# Patient Record
Sex: Female | Born: 1980 | Race: White | Hispanic: No | Marital: Married | Smoking: Never smoker
Health system: Southern US, Community
[De-identification: ages and names within clinical notes are randomized; demographics above are authoritative.]

## PROBLEM LIST (undated history)

## (undated) DIAGNOSIS — J45909 Unspecified asthma, uncomplicated: Secondary | ICD-10-CM

## (undated) HISTORY — PX: APPENDECTOMY: SHX54

## (undated) HISTORY — DX: Unspecified asthma, uncomplicated: J45.909

## (undated) HISTORY — PX: PLACEMENT OF BREAST IMPLANTS: SHX6334

## (undated) HISTORY — PX: LASIK: SHX215

---

## 2017-10-05 ENCOUNTER — Ambulatory Visit: Payer: BLUE CROSS/BLUE SHIELD | Admitting: Allergy and Immunology

## 2017-10-05 ENCOUNTER — Encounter: Payer: Self-pay | Admitting: Allergy and Immunology

## 2017-10-05 VITALS — BP 110/82 | HR 76 | Temp 98.4°F | Resp 16 | Ht 68.25 in | Wt 172.4 lb

## 2017-10-05 DIAGNOSIS — H938X2 Other specified disorders of left ear: Secondary | ICD-10-CM | POA: Diagnosis not present

## 2017-10-05 DIAGNOSIS — J4540 Moderate persistent asthma, uncomplicated: Secondary | ICD-10-CM | POA: Diagnosis not present

## 2017-10-05 DIAGNOSIS — J3089 Other allergic rhinitis: Secondary | ICD-10-CM

## 2017-10-05 MED ORDER — BUDESONIDE-FORMOTEROL FUMARATE 160-4.5 MCG/ACT IN AERO: 2.0000 | INHALATION_SPRAY | Freq: Two times a day (BID) | RESPIRATORY_TRACT | 5 refills | Status: DC

## 2017-10-05 MED ORDER — FLUTICASONE PROPIONATE 50 MCG/ACT NA SUSP: NASAL | 5 refills | Status: DC

## 2017-10-05 NOTE — Progress Notes (Signed)
Dear Dr. Haroldine Laws,  Thank you for referring Lisa Cochran to the [facility] on 10/05/2017.   Below is a summation of this patient's evaluation and recommendations.  Thank you for your referral. I will keep you informed about this patient's response to treatment.   If you have any questions please do not hesitate to contact me.   Sincerely,  Jessica Priest, MD Allergy / Immunology [facility]   ______________________________________________________________________    NEW PATIENT NOTE  Referring Provider: Keturah Barre, MD Primary Provider: Jonathon Bellows, DO Date of office visit: 10/05/2017    Subjective:   Chief Complaint:  Lisa Cochran (DOB: 1980-05-20) is a 37 y.o. female who presents to the clinic on 10/05/2017 with a chief complaint of Asthma and Allergic Rhinitis  .     HPI: Virna presents to this clinic in evaluation of allergies and asthma.  She has a long history of allergic rhinoconjunctivitis with sneezing and nasal congestion that appears to be triggered off by visiting her house located in [place] on the weekends as she works in [place] during the weekdays.  She recently visited with a allergist who found her to be allergic to dog as well as other allergens including trees and feathers and dust mite and cockroach.  She has performed house dust avoidance measures and has been consistently using a nasal steroid and she thinks for the most part that her allergies are under very good control at this point in time.  The dog still remain inside the household in [place].  She has had a 2 to 3-year history of developing problems with her lower airways.  Specifically, she would note that she would develop wheezing when she exercised.  Her allergist gave her Symbicort in October 2018 which has for the most part taken care of all of her lower respiratory tract  symptoms.  Rarely does she use a short acting bronchodilator at this point in time and she can exercise.  She runs 4 miles per day.  However, she does have problems using Symbicort.  She will gag if she uses more than 1 inhalation and thus she has been using her Symbicort 160 at a dose of 1 inhalation twice a day without a spacer.  She has also developed an issue with "crackling" in her left ear.  This appeared to occur after she developed shingles affecting her left face including her left eye.  The crackling only occurs with high-pitched sounds and does not interfere with her ability to hear.  Rather, it accentuates the high-pitched sound so that at the end of the sound she hears crackling.  There is no associated vertigo or hearing loss.  She is actually better now than she was several months ago when she did visit with ENT, Dr. Haroldine Laws, for evaluation of this issue.  Her evaluation apparently did not identify any significant abnormality.  Past Medical History:  Diagnosis Date  . Asthma     Past Surgical History:  Procedure Laterality Date  . APPENDECTOMY    . LASIK    . PLACEMENT OF BREAST IMPLANTS      Allergies as of 10/05/2017   No Known Allergies     Medication List      acyclovir 400 MG tablet Commonly known as:  ZOVIRAX Take 800 mg by mouth 5 (five) times daily.   budesonide-formoterol 160-4.5 MCG/ACT inhaler Commonly known as:  SYMBICORT Inhale 1 puff into the lungs 2 (two) times daily.   fluticasone 50 MCG/ACT nasal spray Commonly known as:  FLONASE Place 1 spray into both nostrils daily.   levonorgestrel-ethinyl estradiol tablet Commonly known as:  ENPRESSE,TRIVORA Take 1 tablet by mouth daily.   PROVENTIL HFA 108 (90 Base) MCG/ACT inhaler Generic drug:  albuterol Inhale into the lungs every 6 (six) hours as needed for wheezing or shortness of breath.       Review of systems negative except as noted in HPI / PMHx or noted below:  Review of Systems    Constitutional: Negative.   HENT: Negative.   Eyes: Negative.   Respiratory: Negative.   Cardiovascular: Negative.   Gastrointestinal: Negative.   Genitourinary: Negative.   Musculoskeletal: Negative.   Skin: Negative.   Neurological: Negative.   Endo/Heme/Allergies: Negative.   Psychiatric/Behavioral: Negative.     Family History  Problem Relation Age of Onset  . Allergic rhinitis Mother   . Allergic rhinitis Brother   . Food Allergy Brother        alpha gal  . Angioedema Neg Hx   . Asthma Neg Hx   . Eczema Neg Hx   . Urticaria Neg Hx     Social History   Socioeconomic History  . Marital status: Married    Spouse name: Not on file  . Number of children: Not on file  . Years of education: Not on file  . Highest education level: Not on file  Occupational History  . Not on file  Social Needs  . Financial resource strain: Not on file  . Food insecurity:    Worry: Not on file    Inability: Not on file  . Transportation needs:    Medical: Not on file    Non-medical: Not on file  Tobacco Use  . Smoking status: Never Smoker  . Smokeless tobacco: Never Used  Substance and Sexual Activity  . Alcohol use: Yes    Frequency: Never    Comment: 3 beers a week  . Drug use: Never  . Sexual activity: Not on file  Lifestyle  . Physical activity:    Days per week: Not on file    Minutes per session: Not on file  . Stress: Not on file  Relationships  . Social connections:    Talks on phone: Not on file    Gets together: Not on file    Attends religious service: Not on file    Active member of club or organization: Not on file    Attends meetings of clubs or organizations: Not on file    Relationship status: Not on file  . Intimate partner violence:    Fear of current or ex partner: Not on file    Emotionally abused: Not on file    Physically abused: Not on file    Forced sexual activity: Not on file  Other Topics Concern  . Not on file  Social History Narrative   . Not on file    Environmental and Social history  Lives in 2 households including [place].  Both household have dry environment, dogs located in [place], no carpet in the bedrooms, plastic on the bed, plastic on the pillow, and no smokers located inside the household.  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  Objective:   Vitals:   10/05/17 0835  BP: 110/82  Pulse: 76  Resp: 16  Temp: 98.4 F (  36.9 C)   Height: 5' 8.25" (173.4 cm) Weight: 172 lb 6.4 oz (78.2 kg)  Physical Exam  HENT:  Head: Normocephalic. Head is without right periorbital erythema and without left periorbital erythema.  Right Ear: Tympanic membrane, external ear and ear canal normal.  Left Ear: Tympanic membrane, external ear and ear canal normal.  Nose: Nose normal. No mucosal edema or rhinorrhea.  Mouth/Throat: Oropharynx is clear and moist and mucous membranes are normal. No oropharyngeal exudate.  Eyes: Pupils are equal, round, and reactive to light. Conjunctivae and lids are normal.  Neck: Trachea normal. No tracheal deviation present. No thyromegaly present.  Cardiovascular: Normal rate, regular rhythm, S1 normal, S2 normal and normal heart sounds.  No murmur heard. Pulmonary/Chest: Effort normal. No stridor. No respiratory distress. She has no wheezes. She has no rales. She exhibits no tenderness.  Abdominal: Soft. She exhibits no distension and no mass. There is no hepatosplenomegaly. There is no tenderness. There is no rebound and no guarding.  Musculoskeletal: She exhibits no edema or tenderness.  Lymphadenopathy:       Head (right side): No tonsillar adenopathy present.       Head (left side): No tonsillar adenopathy present.    She has no cervical adenopathy.    She has no axillary adenopathy.  Neurological: She is alert.  Skin: Rash (Sunburn) noted. She is not diaphoretic. No erythema. No pallor. Nails show no clubbing.    Diagnostics:  Allergy skin tests were not performed.   Spirometry was performed and demonstrated an FEV1 of 3.35 @ 96 % of predicted. FEV1/FVC = 0.86  Assessment and Plan:    1. Asthma, moderate persistent, well-controlled   2. Other allergic rhinitis   3. Crackling sound in ear, left     1.  Allergen avoidance measures - dog  2.  Treat and prevent inflammation:   A.  Symbicort 160 - 2 inhalations twice a day with spacer  B.  Flonase 1 spray each nostril 1-2 times a day  3.  If needed:   A.  OTC antihistamine  B.  Proventil HFA 2 puffs every 4-6 hours  4.  Monitor "crackling" left ear.  Imaging procedure?  Hearing evaluation?  5.  Obtain fall flu vaccine every year  6.  Attempt to avoid sunlight induced damage as much as possible  7.  Return to clinic again in 6 months or earlier if problem  Judine has atopic respiratory disease which is under very good control at this point in time on her current therapy which includes anti-inflammatory medications for both her upper and lower airway.  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  I suspect that she sustained some type of damage to this organ at some point in the past.  I told her that she should follow along as time moves forward regarding the intensity of this issue and whether or not she develops hearing loss and if so she may require further evaluation including an imaging procedure of this organ and formal hearing evaluation.  She also presented to this clinic with sunburn and she is an individual with very little pigment and I had a talk with her today about the need to avoid sunlight damage on her skin in an attempt to lower her risk of skin cancer in the future.  I will see her back in this clinic in 6 months or earlier if there is a problem.  Jessica Priest, MD Allergy / Immunology  [facility] [place]

## 2017-10-05 NOTE — Patient Instructions (Addendum)
  1.  Allergen avoidance measures - dog  2.  Treat and prevent inflammation:   A.  Symbicort 160 - 2 inhalations twice a day with spacer  B.  Flonase 1 spray each nostril 1-2 times a day  3.  If needed:   A.  OTC antihistamine  B.  Proventil HFA 2 puffs every 4-6 hours  4.  Monitor "crackling" left ear.  Imaging procedure?  Hearing evaluation?  5.  Obtain fall flu vaccine every year  6.  Attempt to avoid sunlight induced damage as much as possible  7.  Return to clinic again in 6 months or earlier if problem

## 2017-10-06 ENCOUNTER — Encounter: Payer: Self-pay | Admitting: Allergy and Immunology

## 2018-01-13 ENCOUNTER — Telehealth: Payer: Self-pay | Admitting: Allergy and Immunology

## 2018-01-13 MED ORDER — ALBUTEROL SULFATE HFA 108 (90 BASE) MCG/ACT IN AERS: 2.0000 | INHALATION_SPRAY | Freq: Four times a day (QID) | RESPIRATORY_TRACT | 0 refills | Status: DC | PRN

## 2018-01-13 NOTE — Telephone Encounter (Signed)
Inhaler sent into pharmacy 

## 2018-01-13 NOTE — Telephone Encounter (Signed)
Pt called and said that her dog eat her inhaler and needs a new one Proventil  called [facility] [address] [phone].

## 2018-01-31 ENCOUNTER — Other Ambulatory Visit: Payer: Self-pay | Admitting: Allergy and Immunology

## 2018-08-25 ENCOUNTER — Other Ambulatory Visit: Payer: Self-pay

## 2018-08-25 ENCOUNTER — Other Ambulatory Visit: Payer: Self-pay | Admitting: Nurse Practitioner

## 2018-08-25 ENCOUNTER — Ambulatory Visit
Admission: RE | Admit: 2018-08-25 | Discharge: 2018-08-25 | Disposition: A | Discharge status: Home / Self Care | Payer: No Typology Code available for payment source | Source: Ambulatory Visit | Attending: Nurse Practitioner | Admitting: Nurse Practitioner

## 2018-08-25 ENCOUNTER — Ambulatory Visit: Payer: Self-pay | Admitting: *Deleted

## 2018-08-25 ENCOUNTER — Telehealth: Payer: Self-pay | Admitting: Allergy and Immunology

## 2018-08-25 DIAGNOSIS — M25561 Pain in right knee: Secondary | ICD-10-CM

## 2018-08-25 NOTE — Telephone Encounter (Signed)
It has almost been 1 year since she has been seen in the clinic. Missed her 6 month follow up visit. Needs an office RV.

## 2018-08-25 NOTE — Telephone Encounter (Signed)
Patient is requesting a letter so she can work from home, due to her asthma. She works for the [employer].

## 2018-08-25 NOTE — Telephone Encounter (Signed)
Called patient and advised. An office visit has been scheduled tomorrow with Dr. Delorse Lek for the patient to come in for a follow up visit. Patient verbalized understanding.

## 2018-08-25 NOTE — Telephone Encounter (Signed)
Please advise 

## 2018-08-26 ENCOUNTER — Encounter: Payer: Self-pay | Admitting: Allergy

## 2018-08-26 ENCOUNTER — Ambulatory Visit: Payer: 59 | Admitting: Allergy

## 2018-08-26 VITALS — BP 128/76 | HR 73 | Temp 97.9°F | Resp 16

## 2018-08-26 DIAGNOSIS — J4541 Moderate persistent asthma with (acute) exacerbation: Secondary | ICD-10-CM | POA: Diagnosis not present

## 2018-08-26 DIAGNOSIS — H938X2 Other specified disorders of left ear: Secondary | ICD-10-CM | POA: Diagnosis not present

## 2018-08-26 DIAGNOSIS — J3089 Other allergic rhinitis: Secondary | ICD-10-CM

## 2018-08-26 DIAGNOSIS — J45909 Unspecified asthma, uncomplicated: Secondary | ICD-10-CM | POA: Insufficient documentation

## 2018-08-26 MED ORDER — MONTELUKAST SODIUM 10 MG PO TABS: 10.0000 mg | ORAL_TABLET | Freq: Every day | ORAL | 5 refills | Status: DC

## 2018-08-26 MED ORDER — BUDESONIDE 180 MCG/ACT IN AEPB: 2.0000 | INHALATION_SPRAY | Freq: Two times a day (BID) | RESPIRATORY_TRACT | 5 refills | Status: DC

## 2018-08-26 MED ORDER — LEVOCETIRIZINE DIHYDROCHLORIDE 5 MG PO TABS: 5.0000 mg | ORAL_TABLET | Freq: Every evening | ORAL | 5 refills | Status: DC

## 2018-08-26 NOTE — Patient Instructions (Addendum)
Asthma, moderate persistent, well-controlled Stop Symbicort and begin Pulmicort 180-2 puffs twice a day to prevent cough and wheeze Begin montelukast 10 mg once a day to prevent cough and wheeze Continue albuterol 2 puffs as needed for cough or wheeze Use albuterol 2 puffs 5-15 minutes before exercise to decrease cough and wheeze  Allergic rhinitis Stop Flonase. Begin Rhinocort 1-2 sprays in each nostril once a day as needed for a stuffy nose Begin nasal saline rinses as needed for nasal symptoms. Use this before medicated nasal sprays for best results Begin Xyzal  once a day as needed for a runny nose  Crackling sound in ear, left Return to ENT or audiology for evaluation and treatment  Call the clinic if this treatment plan is not working well for you  Follow up in 2 months or sooner if needed

## 2018-08-26 NOTE — Progress Notes (Addendum)
[address] [place] [id number] Dept: [phone]  FOLLOW UP NOTE  Patient ID: Lisa DewLorien Cochran, female    DOB: 01/31/1981  Age: 38 y.o. MRN: [id number] Date of Office Visit: 08/26/2018  Assessment  Chief Complaint: Asthma  HPI Lisa Cochran is a 38 year old female who presents to the clinic for a follow up visit. She reports her asthma has been "bad" for the last 2 months with symptoms including shortness of breath that is aggravated by exercise and wearing a mask, wheeze with exercise, and cough if she forgets to use Symbicort. She is currently using Symbicort 160-1 puff twice a day with a spacer and albuterol ihaler before exercise which averages 3 times a week. She reports occasionally feeling as though she needs to sigh or yawn to get a full breath into her lungs. She denied reflux or heartburn. She has not tried an inhaler other than Symbicort nor has she tried montelukast. Allergic rhinitis is reported as moderately well controlled with nasal congestion s the main symptom for which she uses Flonase as needed. She reports she used a Environmental consultanteti Pot about 1 year ago with good results. She uses Benadryl about once a month for symptoms including runny nose and sneezing with relief. She reports that she continues to experience a sound in her left ear that occurs after any noise which began several years ago. Of note, she reports that she had herpes zoster in her left ear in 2014 which is about the time her left ear began to be affected. She has seen ENT and there were no acute findings. Her current medications are listed in the chart. She reports that she is actively trying to get pregnant.    Drug Allergies:  No Known Allergies  Physical Exam: BP 128/76 (BP Location: Left Arm, Patient Position: Sitting, Cuff Size: Normal)   Pulse 73   Temp 97.9 F (36.6 C) (Tympanic)   Resp 16   SpO2 96%    Physical Exam Constitutional:      Appearance: Normal appearance.  HENT:     Head:  Normocephalic and atraumatic.     Right Ear: Tympanic membrane normal.     Left Ear: Tympanic membrane normal.     Nose: Nose normal.     Mouth/Throat:     Mouth: Mucous membranes are moist.     Pharynx: Oropharynx is clear.  Eyes:     Conjunctiva/sclera: Conjunctivae normal.  Neck:     Musculoskeletal: Normal range of motion and neck supple.  Cardiovascular:     Rate and Rhythm: Normal rate and regular rhythm.     Heart sounds: Normal heart sounds. No murmur.  Pulmonary:     Effort: Pulmonary effort is normal.     Breath sounds: Normal breath sounds.     Comments: Lungs clear to auscultation Musculoskeletal: Normal range of motion.  Skin:    General: Skin is warm and dry.  Neurological:     Mental Status: She is alert and oriented to person, place, and time.  Psychiatric:        Mood and Affect: Mood normal.        Behavior: Behavior normal.        Thought Content: Thought content normal.        Judgment: Judgment normal.     Diagnostics: FVC 4.05, FEV1 3.49. Predicted FVC 4.25, predicted FEV1 3.47. Spirometry is within the normal range.  Assessment and Plan: 1. Moderate persistent asthma with acute exacerbation  2. Other allergic rhinitis   3. Crackling sound in ear, left     Meds ordered this encounter  Medications  . montelukast (SINGULAIR) 10 MG tablet    Sig: Take 1 tablet (10 mg total) by mouth at bedtime.    Dispense:  30 tablet    Refill:  5  . levocetirizine (XYZAL) 5 MG tablet    Sig: Take 1 tablet (5 mg total) by mouth every evening.    Dispense:  30 tablet    Refill:  5  . budesonide (PULMICORT FLEXHALER) 180 MCG/ACT inhaler    Sig: Inhale 2 puffs into the lungs 2 (two) times a day.    Dispense:  2 Inhaler    Refill:  5    Patient Instructions  Asthma, moderate persistent, well-controlled Stop Symbicort and begin Pulmicort 180-2 puffs twice a day to prevent cough and wheeze Begin montelukast 10 mg once a day to prevent cough and wheeze  Continue albuterol 2 puffs as needed for cough or wheeze Use albuterol 2 puffs 5-15 minutes before exercise to decrease cough and wheeze  Allergic rhinitis Stop Flonase. Begin Rhinocort 1-2 sprays in each nostril once a day as needed for a stuffy nose Begin nasal saline rinses as needed for nasal symptoms. Use this before medicated nasal sprays for best results Begin Xyzal  once a day as needed for a runny nose  Crackling sound in ear, left Return to ENT or audiology for evaluation and treatment  Call the clinic if this treatment plan is not working well for you  Follow up in 2 months or sooner if needed   Return in about 2 months (around 10/26/2018), or if symptoms worsen or fail to improve.    Thank you for the opportunity to care for this patient.  Please do not hesitate to contact me with questions.  Thermon Leyland, FNP [facility]

## 2018-11-01 ENCOUNTER — Encounter: Payer: Self-pay | Admitting: Allergy and Immunology

## 2018-11-01 ENCOUNTER — Other Ambulatory Visit: Payer: Self-pay

## 2018-11-01 ENCOUNTER — Ambulatory Visit: Payer: 59 | Admitting: Allergy and Immunology

## 2018-11-01 VITALS — BP 122/78 | HR 70 | Temp 97.9°F | Resp 16 | Ht 67.5 in | Wt 176.6 lb

## 2018-11-01 DIAGNOSIS — J3089 Other allergic rhinitis: Secondary | ICD-10-CM

## 2018-11-01 DIAGNOSIS — J4540 Moderate persistent asthma, uncomplicated: Secondary | ICD-10-CM

## 2018-11-01 NOTE — Progress Notes (Signed)
[facility] [place] [facility] [place]   Follow-up Note  Referring Provider: No ref. provider found Primary Provider: Patient, No Pcp Per Date of Office Visit: 11/01/2018  Subjective:   Lisa Cochran (DOB: [birth year]) is a 38 y.o. female who returns to the [facility] on 11/01/2018 in re-evaluation of the following:  HPI: Lisa Cochran presents to this clinic in reevaluation of asthma and allergic rhinitis.  Her last visit to this clinic was with our nurse practitioner on 26 Aug 2018 at which point in time she appeared to be experiencing a springtime flare of her respiratory tract disease.  While utilizing Pulmicort about 3 times per week and consistent use of montelukast daily as well as some very occasional nasal steroid she feels as though she has been doing quite well regarding both her upper and lower airway disease.  She rarely uses a short acting bronchodilator and she can exercise with running with no difficulty at all.  She has not required a systemic steroid or an antibiotic for any type of airway issue since her last visit.  She is now [redacted] weeks pregnant.  Allergies as of 11/01/2018   No Known Allergies     Medication List    acyclovir 800 MG tablet Commonly known as: ZOVIRAX TAKE 1 TO 2 TABLETS AT BEDTIME   albuterol 108 (90 Base) MCG/ACT inhaler Commonly known as: VENTOLIN HFA INHALE 2 PUFFS INTO THE LUNGS EVERY 4-6 HOURS AS NEEDED FOR WHEEZING OR SHORTNESS OF BREATH   budesonide 180 MCG/ACT inhaler Commonly known as: Pulmicort Flexhaler Inhale 2 puffs into the lungs 2 (two) times a day.   fluticasone 50 MCG/ACT nasal spray Commonly known as: FLONASE Use 1 spray per nostril 1-2 times daily   levocetirizine 5 MG tablet Commonly known as: Xyzal Take 1 tablet (5 mg total) by mouth every evening.   montelukast 10 MG tablet Commonly known as: SINGULAIR Take 1 tablet (10 mg total) by mouth at bedtime.       Past Medical History:   Diagnosis Date  . Asthma     Past Surgical History:  Procedure Laterality Date  . APPENDECTOMY    . LASIK    . PLACEMENT OF BREAST IMPLANTS      Review of systems negative except as noted in HPI / PMHx or noted below:  Review of Systems  Constitutional: Negative.   HENT: Negative.   Eyes: Negative.   Respiratory: Negative.   Cardiovascular: Negative.   Gastrointestinal: Negative.   Genitourinary: Negative.   Musculoskeletal: Negative.   Skin: Negative.   Neurological: Negative.   Endo/Heme/Allergies: Negative.   Psychiatric/Behavioral: Negative.      Objective:   Vitals:   11/01/18 1530  BP: 122/78  Pulse: 70  Resp: 16  Temp: 97.9 F (36.6 C)  SpO2: 98%   Height: 5' 7.5" (171.5 cm)  Weight: 176 lb 9.6 oz (80.1 kg)   Physical Exam Constitutional:      Appearance: She is not diaphoretic.  HENT:     Head: Normocephalic.     Right Ear: Tympanic membrane, ear canal and external ear normal.     Left Ear: Tympanic membrane, ear canal and external ear normal.     Nose: Nose normal. No mucosal edema or rhinorrhea.     Mouth/Throat:     Pharynx: Uvula midline. No oropharyngeal exudate.  Eyes:     Conjunctiva/sclera: Conjunctivae normal.  Neck:     Thyroid: No thyromegaly.  Trachea: Trachea normal. No tracheal tenderness or tracheal deviation.  Cardiovascular:     Rate and Rhythm: Normal rate and regular rhythm.     Heart sounds: Normal heart sounds, S1 normal and S2 normal. No murmur.  Pulmonary:     Effort: No respiratory distress.     Breath sounds: Normal breath sounds. No stridor. No wheezing or rales.  Lymphadenopathy:     Head:     Right side of head: No tonsillar adenopathy.     Left side of head: No tonsillar adenopathy.     Cervical: No cervical adenopathy.  Skin:    Findings: No erythema or rash.     Nails: There is no clubbing.   Neurological:     Mental Status: She is alert.     Diagnostics:    Spirometry was performed and  demonstrated an FEV1 of 3.21 at 93 % of predicted.  Assessment and Plan:   1. Asthma, moderate persistent, well-controlled   2. Other allergic rhinitis     1.  Treat and prevent inflammation:   A.  Pulmicort 180 - 2 inhalations twice a day 3-7 times per week  B.  Rhinocort -  1-2 spray each nostril 3-7 times per week  C.  Montelukast 10 mg - 1 tablet 1 time per day  2.  If needed:   A.  Xyzal  B.  Proventil HFA 2 puffs every 4-6 hours  3. Increase Pulmicort to 3 inhalations 3 times per day for asthma flare up.   4. Obtain fall flu vaccine (and COVID vaccine - Pregnancy???)  5. Return to clinic in 6 months or earlier if problem  Lisa Cochran appears to be doing very well on her current therapy which includes anti-inflammatory agents for both her upper and lower airway.  Assuming she continues to do well on this plan we will see her back in this clinic in 6 months or earlier if there is a problem.  I did encourage her to obtain a fall flu vaccine even with her pregnancy.  Whether or not she would qualify for a COVID vaccine because of her pregnancy is still an open question at this point.  Lisa SchimkeEric Kozlow, MD Allergy / Immunology [facility]

## 2018-11-01 NOTE — Patient Instructions (Addendum)
  1.  Treat and prevent inflammation:   A.  Pulmicort 180 - 2 inhalations twice a day 3-7 times per week  B.  Rhinocort -  1-2 spray each nostril 3-7 times per week  C.  Montelukast 10 mg - 1 tablet 1 time per day  2.  If needed:   A.  Xyzal  B.  Proventil HFA 2 puffs every 4-6 hours  3. Increase Pulmicort to 3 inhalations 3 times per day for asthma flare up.   4. Obtain fall flu vaccine (and COVID vaccine-Pregnancy???)  5. Return to clinic in 6 months or earlier if problem

## 2018-11-02 ENCOUNTER — Encounter: Payer: Self-pay | Admitting: Allergy and Immunology

## 2018-11-18 ENCOUNTER — Other Ambulatory Visit: Payer: Self-pay | Admitting: *Deleted

## 2018-11-18 DIAGNOSIS — Z20822 Contact with and (suspected) exposure to covid-19: Secondary | ICD-10-CM

## 2018-11-19 LAB — NOVEL CORONAVIRUS, NAA: SARS-CoV-2, NAA: NOT DETECTED

## 2018-12-06 ENCOUNTER — Other Ambulatory Visit: Payer: Self-pay | Admitting: Family Medicine

## 2018-12-28 ENCOUNTER — Other Ambulatory Visit: Payer: Self-pay

## 2018-12-28 DIAGNOSIS — Z20822 Contact with and (suspected) exposure to covid-19: Secondary | ICD-10-CM

## 2018-12-29 LAB — NOVEL CORONAVIRUS, NAA: SARS-CoV-2, NAA: NOT DETECTED

## 2019-02-15 ENCOUNTER — Other Ambulatory Visit: Payer: Self-pay

## 2019-02-15 DIAGNOSIS — Z20822 Contact with and (suspected) exposure to covid-19: Secondary | ICD-10-CM

## 2019-02-16 LAB — NOVEL CORONAVIRUS, NAA: SARS-CoV-2, NAA: NOT DETECTED

## 2019-02-16 LAB — SPECIMEN STATUS REPORT

## 2019-03-17 ENCOUNTER — Other Ambulatory Visit: Payer: Self-pay

## 2019-03-20 ENCOUNTER — Ambulatory Visit: Payer: 59 | Attending: Internal Medicine

## 2019-03-20 DIAGNOSIS — Z20822 Contact with and (suspected) exposure to covid-19: Secondary | ICD-10-CM

## 2019-03-21 LAB — NOVEL CORONAVIRUS, NAA: SARS-CoV-2, NAA: NOT DETECTED

## 2019-04-04 ENCOUNTER — Ambulatory Visit: Payer: 59 | Admitting: Allergy and Immunology

## 2019-04-29 ENCOUNTER — Other Ambulatory Visit: Payer: Self-pay | Admitting: Otolaryngology

## 2019-04-29 DIAGNOSIS — H9312 Tinnitus, left ear: Secondary | ICD-10-CM

## 2019-04-29 DIAGNOSIS — IMO0001 Reserved for inherently not codable concepts without codable children: Secondary | ICD-10-CM

## 2019-04-29 DIAGNOSIS — H918X2 Other specified hearing loss, left ear: Secondary | ICD-10-CM

## 2019-05-01 ENCOUNTER — Other Ambulatory Visit: Payer: Self-pay | Admitting: Otolaryngology

## 2019-05-02 ENCOUNTER — Other Ambulatory Visit: Payer: Self-pay | Admitting: Otolaryngology

## 2019-05-02 DIAGNOSIS — Z77018 Contact with and (suspected) exposure to other hazardous metals: Secondary | ICD-10-CM

## 2019-05-16 ENCOUNTER — Ambulatory Visit: Payer: 59 | Admitting: Allergy and Immunology

## 2019-05-16 ENCOUNTER — Encounter: Payer: Self-pay | Admitting: Allergy and Immunology

## 2019-05-16 ENCOUNTER — Other Ambulatory Visit: Payer: Self-pay

## 2019-05-16 DIAGNOSIS — J3089 Other allergic rhinitis: Secondary | ICD-10-CM | POA: Diagnosis not present

## 2019-05-16 DIAGNOSIS — J4540 Moderate persistent asthma, uncomplicated: Secondary | ICD-10-CM | POA: Diagnosis not present

## 2019-05-16 NOTE — Progress Notes (Signed)
[place] [facility] [place] [facility] [place]   Follow-up Note  Referring Provider: No ref. provider found Primary Provider: Patient, No Pcp Per Date of Office Visit: 05/16/2019  Subjective:   Lisa Cochran (DOB: 14-Jul-1980) is a 39 y.o. female who returns to [facility] on 05/16/2019 in re-evaluation of the following:  HPI: This is an E - med visit requested by patient who is located at home. Adelayde is followed in this clinic for asthma and allergic rhinitis.  Her last visit to this clinic was 01 November 2018 at which point in time she was [redacted] weeks pregnant.  Delivery 03 May 2019.  No bronchospastic symptoms presently. Off Pulmicort for months. Montelukast works very well. Rare use of albuterol. No systemic steroid or antibiotic required.   No nasal problems.  Intermittent use of nasal steroid  Did receive flu vaccine.   Allergies as of 05/16/2019   No Known Allergies     Medication List     acyclovir 800 MG tablet Commonly known as: ZOVIRAX TAKE 1 TO 2 TABLETS AT BEDTIME   albuterol 108 (90 Base) MCG/ACT inhaler Commonly known as: VENTOLIN HFA INHALE 2 PUFFS INTO THE LUNGS EVERY 4-6 HOURS AS NEEDED FOR WHEEZING OR SHORTNESS OF BREATH   budesonide 180 MCG/ACT inhaler Commonly known as: Pulmicort Flexhaler Inhale 2 puffs into the lungs 2 (two) times a day.   fluticasone 50 MCG/ACT nasal spray Commonly known as: FLONASE Use 1 spray per nostril 1-2 times daily   levocetirizine 5 MG tablet Commonly known as: Xyzal Take 1 tablet (5 mg total) by mouth every evening.   montelukast 10 MG tablet Commonly known as: SINGULAIR TAKE 1 TABLET BY MOUTH AT BEDTIME       Past Medical History:  Diagnosis Date  . Asthma     Past Surgical History:  Procedure Laterality Date  . APPENDECTOMY    . LASIK    . PLACEMENT OF BREAST IMPLANTS      Review of systems negative except as noted in HPI / PMHx or noted below:  Review of Systems   Constitutional: Negative.   HENT: Negative.   Eyes: Negative.   Respiratory: Negative.   Cardiovascular: Negative.   Gastrointestinal: Negative.   Genitourinary: Negative.   Musculoskeletal: Negative.   Skin: Negative.   Neurological: Negative.   Endo/Heme/Allergies: Negative.   Psychiatric/Behavioral: Negative.      Objective:   Vitals:          Physical Exam-deferred  Diagnostics:   The patient had an Asthma Control Test with the following results: ACT Total Score: 25.    Assessment and Plan:   1. Asthma, moderate persistent, well-controlled   2. Other allergic rhinitis      1.  Treat and prevent inflammation:   A. Montelukast 10 mg - 1 tablet 1 time per day  2.  If needed:   A.  Xyzal  B.  Proventil HFA 2 puffs every 4-6 hours  C.  Rhinocort /budesonide - 1-2 sprays each nostril 1 time per day  3. Use Pulmicort 180 3 inhalations 3 times per day for asthma flare up.   4. Obtain Covid vaccine ( breast feeding?)  5. Return to clinic in 12 months or earlier if problem  It sounds as though Heer is really doing very well on her current therapy and she will continue to utilize a leukotriene modifier on a regular basis and she has the option of utilizing a nasal  steroid and a "action plan" should she develop an asthma flare in the future.  If she does well with this plan I can see her back in this clinic in 1 year or earlier if there is a problem.  Laurette Schimke, MD Allergy / Immunology [facility]

## 2019-05-16 NOTE — Patient Instructions (Addendum)
  1.  Treat and prevent inflammation:   A. Montelukast 10 mg - 1 tablet 1 time per day  2.  If needed:   A.  Xyzal  B.  Proventil HFA 2 puffs every 4-6 hours  C.  Rhinocort /budesonide - 1-2 sprays each nostril 1 time per day  3. Use Pulmicort 180 3 inhalations 3 times per day for asthma flare up.   4. Obtain Covid vaccine ( breast feeding?)  5. Return to clinic in 12 months or earlier if problem

## 2019-05-17 ENCOUNTER — Encounter: Payer: Self-pay | Admitting: Allergy and Immunology

## 2019-05-19 ENCOUNTER — Other Ambulatory Visit: Payer: 59

## 2019-05-19 ENCOUNTER — Other Ambulatory Visit: Payer: Self-pay

## 2019-05-19 ENCOUNTER — Ambulatory Visit
Admission: RE | Admit: 2019-05-19 | Discharge: 2019-05-19 | Disposition: A | Discharge status: Home / Self Care | Payer: 59 | Source: Ambulatory Visit | Attending: Otolaryngology | Admitting: Otolaryngology

## 2019-05-19 DIAGNOSIS — Z77018 Contact with and (suspected) exposure to other hazardous metals: Secondary | ICD-10-CM

## 2019-05-20 ENCOUNTER — Ambulatory Visit
Admission: RE | Admit: 2019-05-20 | Discharge: 2019-05-20 | Disposition: A | Discharge status: Home / Self Care | Payer: 59 | Source: Ambulatory Visit | Attending: Otolaryngology | Admitting: Otolaryngology

## 2019-05-20 DIAGNOSIS — H918X2 Other specified hearing loss, left ear: Secondary | ICD-10-CM

## 2019-05-20 DIAGNOSIS — H9312 Tinnitus, left ear: Secondary | ICD-10-CM

## 2019-05-20 DIAGNOSIS — IMO0001 Reserved for inherently not codable concepts without codable children: Secondary | ICD-10-CM

## 2019-05-20 MED ORDER — GADOBENATE DIMEGLUMINE 529 MG/ML IV SOLN
20.0000 mL | Freq: Once | INTRAVENOUS | Status: AC | PRN
  Administered 2019-05-20: 20 mL via INTRAVENOUS

## 2019-10-04 ENCOUNTER — Telehealth: Payer: Self-pay | Admitting: Allergy and Immunology

## 2019-10-04 MED ORDER — ALBUTEROL SULFATE HFA 108 (90 BASE) MCG/ACT IN AERS: INHALATION_SPRAY | RESPIRATORY_TRACT | 1 refills | Status: DC

## 2019-10-04 NOTE — Telephone Encounter (Signed)
Patient called and needs to a have ventolin inhaler called into [facility] [address] [phone]

## 2019-10-04 NOTE — Telephone Encounter (Signed)
Refill sent and patient informed.

## 2019-10-11 ENCOUNTER — Other Ambulatory Visit: Payer: 59

## 2019-11-01 ENCOUNTER — Telehealth: Payer: Self-pay | Admitting: Allergy and Immunology

## 2019-11-01 NOTE — Telephone Encounter (Signed)
Patient called back but was placed on hold. I picked back up and the patient was no longer on the line.

## 2019-11-01 NOTE — Telephone Encounter (Signed)
Left message for patient to call back  

## 2019-11-01 NOTE — Telephone Encounter (Signed)
Dr Kozlow please advise 

## 2019-11-01 NOTE — Telephone Encounter (Signed)
Please inform patient that I recommend that she obtain the Covid vaccine as soon as possible if she has not obtained this vaccination yet.

## 2019-11-01 NOTE — Telephone Encounter (Signed)
Patient called and would like to find out about the new delta variant and her asthma . Last year she work from home and not sure about now. [phone]

## 2019-11-02 NOTE — Telephone Encounter (Signed)
I spoke with patient and advised her of this. She has alrady had the vaccine but had some other questions. She is going to send Dr. Lucie Leather a mychart message.

## 2019-11-03 ENCOUNTER — Encounter: Payer: Self-pay | Admitting: Allergy and Immunology

## 2019-12-01 ENCOUNTER — Other Ambulatory Visit: Payer: 59

## 2019-12-01 ENCOUNTER — Other Ambulatory Visit: Payer: Self-pay

## 2019-12-01 DIAGNOSIS — Z20822 Contact with and (suspected) exposure to covid-19: Secondary | ICD-10-CM

## 2019-12-02 LAB — SPECIMEN STATUS REPORT

## 2019-12-02 LAB — NOVEL CORONAVIRUS, NAA: SARS-CoV-2, NAA: NOT DETECTED

## 2020-04-22 ENCOUNTER — Other Ambulatory Visit: Payer: 59

## 2020-04-22 DIAGNOSIS — Z20822 Contact with and (suspected) exposure to covid-19: Secondary | ICD-10-CM

## 2020-04-23 LAB — NOVEL CORONAVIRUS, NAA: SARS-CoV-2, NAA: NOT DETECTED

## 2020-04-23 LAB — SARS-COV-2, NAA 2 DAY TAT

## 2020-04-25 ENCOUNTER — Other Ambulatory Visit: Payer: 59

## 2020-04-25 DIAGNOSIS — Z20822 Contact with and (suspected) exposure to covid-19: Secondary | ICD-10-CM

## 2020-04-26 LAB — NOVEL CORONAVIRUS, NAA: SARS-CoV-2, NAA: NOT DETECTED

## 2020-04-26 LAB — SARS-COV-2, NAA 2 DAY TAT

## 2020-04-29 ENCOUNTER — Other Ambulatory Visit: Payer: 59

## 2020-04-29 DIAGNOSIS — Z20822 Contact with and (suspected) exposure to covid-19: Secondary | ICD-10-CM

## 2020-04-30 LAB — SARS-COV-2, NAA 2 DAY TAT

## 2020-04-30 LAB — NOVEL CORONAVIRUS, NAA: SARS-CoV-2, NAA: NOT DETECTED

## 2020-05-08 ENCOUNTER — Other Ambulatory Visit: Payer: Self-pay | Admitting: Family Medicine

## 2020-05-09 ENCOUNTER — Other Ambulatory Visit: Payer: Self-pay | Admitting: Allergy and Immunology

## 2020-05-16 ENCOUNTER — Other Ambulatory Visit: Payer: Self-pay | Admitting: Allergy and Immunology

## 2020-07-20 NOTE — Patient Instructions (Addendum)
Moderate persistent asthma Stop pulmicort Start prednisone 10 mg taking 1 tablet twice a day for 4 days, then on the fifth day take one tablet and stop. Start montelukast (Singulair) 10 mg once a day to help prevent cough and wheeze.Patient cautioned that rarely some children/adults can experience behavioral changes after beginning montelukast. These side effects are rare, however, if you notice any change, notify the clinic and discontinue montelukast. May use albuterol 2 puffs every 4-6 hours as needed for cough, wheeze,tightness in chest, or shortness of breath. Also, may use albuterol 2 puffs 5-15 minutes prior to exercise. For now and with asthma flares begin Flovent 110 mcg 2 puffs twice a day with spacer until symptoms back to baseline  Other allergic rhinitis Start azelastine nasal spray 1-2 sprays each nostril twice a day as needed for drainage down throat/runny nose Continue montelukast (Singulair) 10 mg as above Continue levocetirizine (Xyzal) 5 mg once a day as needed for runny nose/itching Continue fluticasone nasal spray 1-2 sprays each nostril once a day as needed for stuffy nose.  Please let us know if this treatment plan is not working well for you. Schedule a follow up appointment in 2 months or sooner if needed

## 2020-07-22 ENCOUNTER — Other Ambulatory Visit: Payer: Self-pay | Admitting: Family

## 2020-07-22 ENCOUNTER — Other Ambulatory Visit: Payer: Self-pay

## 2020-07-22 ENCOUNTER — Encounter: Payer: Self-pay | Admitting: Family

## 2020-07-22 ENCOUNTER — Ambulatory Visit: Payer: 59 | Admitting: Family

## 2020-07-22 VITALS — BP 122/80 | HR 68 | Temp 98.0°F | Resp 18 | Ht 67.0 in | Wt 178.0 lb

## 2020-07-22 DIAGNOSIS — J4541 Moderate persistent asthma with (acute) exacerbation: Secondary | ICD-10-CM | POA: Diagnosis not present

## 2020-07-22 DIAGNOSIS — J3089 Other allergic rhinitis: Secondary | ICD-10-CM

## 2020-07-22 MED ORDER — LEVOCETIRIZINE DIHYDROCHLORIDE 5 MG PO TABS: 5.0000 mg | ORAL_TABLET | Freq: Every evening | ORAL | 5 refills | Status: DC

## 2020-07-22 MED ORDER — AZELASTINE HCL 0.1 % NA SOLN: NASAL | 5 refills | Status: DC

## 2020-07-22 MED ORDER — FLUTICASONE PROPIONATE 50 MCG/ACT NA SUSP: NASAL | 5 refills | Status: DC

## 2020-07-22 MED ORDER — AZELASTINE HCL 0.15 % NA SOLN: 2.0000 | Freq: Two times a day (BID) | NASAL | 5 refills | Status: DC | PRN

## 2020-07-22 MED ORDER — MONTELUKAST SODIUM 10 MG PO TABS: 1.0000 | ORAL_TABLET | Freq: Every day | ORAL | 5 refills | Status: DC

## 2020-07-22 MED ORDER — FLOVENT HFA 110 MCG/ACT IN AERO: INHALATION_SPRAY | RESPIRATORY_TRACT | 3 refills | Status: DC

## 2020-07-22 MED ORDER — ALBUTEROL SULFATE HFA 108 (90 BASE) MCG/ACT IN AERS: INHALATION_SPRAY | RESPIRATORY_TRACT | 1 refills | Status: DC

## 2020-07-22 NOTE — Telephone Encounter (Signed)
Please see if azelastine 0.15 using 1-2 sprays each nostril twice a day is covered?

## 2020-07-22 NOTE — Telephone Encounter (Signed)
Noted! Thank you

## 2020-07-22 NOTE — Progress Notes (Signed)
[address] [place] [address] Dept: [phone]  FOLLOW UP NOTE  Patient ID: Lisa Cochran, female    DOB: Feb 08, 1981  Age: 40 y.o. MRN: [id number] Date of Office Visit: 07/22/2020  Assessment  Chief Complaint: Cough (Got covid in early feb and now has a bad cough with congestion. Thinks it came from child's daycare center as well ), Asthma, and Allergic Rhinitis  (No flares.  2 weeks ago had sneezing, runny nose it cleared up )  HPI Lisa Cochran is a 40 year old female who presents today for an acute visit.  She was last seen on May 16, 2019 by Dr. Lucie Leather for moderate persistent asthma and allergic rhinitis.  She now has a 86-month-old child and is not breast-feeding.  Moderate persistent asthma is reported as not well controlled with albuterol as needed.  She has been out of montelukast for at least 6 months and her Pulmicort flex inhaler is out of date.  She used Pulmicort flexihaler 1 day and only last week and is not sure if she even used it correctly.  She reports for the past week and a half she has had a productive cough that she feels is due to a tickle in her throat.  The cough sounds like a barking seal.  She reports wheezing when lying down or having a coughing fit.  She also reports shortness of breath when she is wheezing, coughing, or working out.  She very seldom has nocturnal awakenings due to breathing problems.  She denies any tightness in her chest.  Since her last office visit she has not required any systemic steroids or made any trips to the emergency room or urgent care due to breathing problems.  She is using her albuterol 3-5 times a week before exercise, but has not been using it for cough or wheeze.  She reports that she was diagnosed with COVID-19 in February and her symptoms were not that bad until day 8.  On day 8 her symptoms got worse and the telemed provider told her to start back on her allergy and asthma medications.  At that time all she had  was Xyzal and Flonase and this seemed to help clear up her symptoms.  She mentions that her child is now in daycare and she seems to be getting everything that he gets.  Allergic rhinitis is reported as moderately controlled with levocetirizine 5 mg once a day, and Flonase nasal spray.  She reports postnasal drip, little bit of clear rhinorrhea, and nasal congestion at times at night.  She has not had any sinus infections since we last saw her.  She has done a few rapid COVID-19 test at home and reports that they are negative.   Drug Allergies:  No Known Allergies  Review of Systems: Review of Systems  Constitutional: Negative for chills and fever.  HENT:       Reports postnasal drip, little bit of clear rhinorrhea, nasal congestions at times at night  Eyes:       Denies itchy watery eyes  Respiratory: Positive for cough and wheezing.        Reports productive cough with clear sputum, wheezing when lying down or coughing, and shortness of breath when wheezing, coughing, or working out.  Cardiovascular: Negative for chest pain and palpitations.  Gastrointestinal: Negative for heartburn.       Denies reflux  Genitourinary: Negative for dysuria.  Skin: Negative for itching and rash.  Neurological: Positive for headaches.  Reported a headache last Thursday from coughing  Endo/Heme/Allergies: Positive for environmental allergies.     Physical Exam: BP 122/80   Pulse 68   Temp 98 F (36.7 C)   Resp 18   Ht 5\' 7"  (1.702 m)   Wt 178 lb (80.7 kg)   SpO2 97%   BMI 27.88 kg/m    Physical Exam Constitutional:      Appearance: Normal appearance.  HENT:     Head: Normocephalic and atraumatic.     Comments: Pharynx slightly erythematous with white drainage noted, eyes normal, ears normal, nose bilateral lower turbinate mildly edematous and slightly erythematous with no drainage noted.    Right Ear: Tympanic membrane, ear canal and external ear normal.     Left Ear: Tympanic  membrane, ear canal and external ear normal.     Mouth/Throat:     Mouth: Mucous membranes are moist.     Pharynx: Oropharynx is clear.  Eyes:     Conjunctiva/sclera: Conjunctivae normal.  Cardiovascular:     Rate and Rhythm: Regular rhythm.     Heart sounds: Normal heart sounds.  Pulmonary:     Effort: Pulmonary effort is normal.     Breath sounds: Normal breath sounds.     Comments: Lungs clear to auscultation Musculoskeletal:     Cervical back: Neck supple.  Skin:    General: Skin is warm.  Neurological:     Mental Status: She is alert and oriented to person, place, and time.  Psychiatric:        Mood and Affect: Mood normal.        Behavior: Behavior normal.        Thought Content: Thought content normal.        Judgment: Judgment normal.     Diagnostics: FVC 3.47 L, FEV1 2.81 L.  Predicted FVC 4.07 L, FEV1 3.32 L.  Spirometry indicates normal ventilatory function.  Assessment and Plan: 1. Moderate persistent asthma with acute exacerbation   2. Other allergic rhinitis     No orders of the defined types were placed in this encounter.   Patient Instructions  Moderate persistent asthma Stop pulmicort Start prednisone 10 mg taking 1 tablet twice a day for 4 days, then on the fifth day take one tablet and stop. Start montelukast (Singulair) 10 mg once a day to help prevent cough and wheeze.Patient cautioned that rarely some children/adults can experience behavioral changes after beginning montelukast. These side effects are rare, however, if you notice any change, notify the clinic and discontinue montelukast. May use albuterol 2 puffs every 4-6 hours as needed for cough, wheeze,tightness in chest, or shortness of breath. Also, may use albuterol 2 puffs 5-15 minutes prior to exercise. For now and with asthma flares begin Flovent 110 mcg 2 puffs twice a day with spacer until symptoms back to baseline  Other allergic rhinitis Start azelastine nasal spray 1-2 sprays each  nostril twice a day as needed for drainage down throat/runny nose Continue montelukast (Singulair) 10 mg as above Continue levocetirizine (Xyzal) 5 mg once a day as needed for runny nose/itching Continue fluticasone nasal spray 1-2 sprays each nostril once a day as needed for stuffy nose.  Please let know if this treatment plan is not working well for you. Schedule a follow up appointment in 2 months or sooner if needed   Return in about 2 months (around 09/21/2020), or if symptoms worsen or fail to improve.    Thank you for the opportunity to care  for this patient.  Please do not hesitate to contact me with questions.  Nehemiah Settle, FNP [facility]

## 2020-07-22 NOTE — Telephone Encounter (Signed)
Please see if Patanase  (olopatadine) 0.6% 2 sprays each nostril twice a day as needed for runny nose/drainage is covered by her insurance since azelastine is not. Thank you.

## 2020-07-22 NOTE — Telephone Encounter (Signed)
Called patient to inform her that we switch her to patanase nasal spray and she is to do 2 sprays each nostril twice a day as needed for runny nose/drainage. It is covered by her insurance vs. the azelastine nasal spay. She verbalized understanding

## 2020-07-23 NOTE — Telephone Encounter (Signed)
This is already taken care of. Thank you! We already sent in a prescription yesterday for Patanase ( olopatadine )nasal spray in azelastines place.

## 2020-07-23 NOTE — Telephone Encounter (Signed)
Looks like 0.15 azelastine is not covered by pts insurance

## 2020-09-24 ENCOUNTER — Other Ambulatory Visit: Payer: Self-pay

## 2020-09-24 ENCOUNTER — Encounter: Payer: Self-pay | Admitting: Allergy and Immunology

## 2020-09-24 ENCOUNTER — Ambulatory Visit: Payer: 59 | Admitting: Allergy and Immunology

## 2020-09-24 VITALS — BP 102/68 | HR 74 | Temp 97.5°F | Resp 18 | Ht 68.0 in | Wt 180.4 lb

## 2020-09-24 DIAGNOSIS — K219 Gastro-esophageal reflux disease without esophagitis: Secondary | ICD-10-CM

## 2020-09-24 DIAGNOSIS — J3089 Other allergic rhinitis: Secondary | ICD-10-CM | POA: Diagnosis not present

## 2020-09-24 DIAGNOSIS — J4530 Mild persistent asthma, uncomplicated: Secondary | ICD-10-CM | POA: Diagnosis not present

## 2020-09-24 NOTE — Progress Notes (Signed)
[place] [facility] [place] [facility] [place] [facility] [place] [facility] [place]   Follow-up Note  Referring Provider: No ref. provider found Primary Provider: Patient, No Pcp Per (Inactive) Date of Office Visit: 09/24/2020  Subjective:   Lisa Cochran (DOB: September 03, 1980) is a 40 y.o. female who [facility] 09[facility]/2020 in re-evaluation of the following:  HPI: Lisa Cochran presents to this clinic in evaluation of asthma and allergic rhinitis.  I have not seen her in this clinic since 16 May 2019.  She did visit with our nurse practitioner on 22 July 2020 with a slight flareup requiring a systemic steroid.  During her last visit she was given Flovent for some increased asthma activity but could not continue on this agent because it precipitated headaches.  She still continues to have some intermittent coughing and wheezing and uses a bronchodilator on an intermittent basis multiple times per week.  All of her asthma activity appears to have been a more significant issue ever since she contracted COVID in February 2022.  Fortunately, she has not required any systemic steroids since her last visit to address an asthma exacerbation.  Her upper airway issue appears to be going relatively well at this point.  She has noticed since her COVID infection in February 2022 that she has had some problems with her throat.  She has little bit of throat clearing and a little bit of sensation of a lump in her throat or spot in her throat.  She does not have any classic reflux symptoms although on occasion she will have regurgitation if she eats the wrong food and overeats and eats late.  She does consume caffeine.  Allergies as of 09/24/2020   No Known Allergies      Medication List     albuterol 108 (90 Base) MCG/ACT inhaler Commonly known as: VENTOLIN HFA INHALE 2 PUFFS INTO THE LUNGS EVERY 4-6 HOURS AS NEEDED FOR WHEEZING OR SHORTNESS OF BREATH   fluticasone 50 MCG/ACT nasal spray Commonly  known as: FLONASE Use 1 to 2 sprays in each nostril once a day as needed for stuffy nose   levocetirizine 5 MG tablet Commonly known as: XYZAL Take 1 tablet (5 mg total) by mouth every evening.   montelukast 10 MG tablet Commonly known as: SINGULAIR Take 1 tablet (10 mg total) by mouth at bedtime.       Past Medical History:  Diagnosis Date   Asthma     Past Surgical History:  Procedure Laterality Date   APPENDECTOMY     LASIK     PLACEMENT OF BREAST IMPLANTS      Review of systems negative except as noted in HPI / PMHx or noted below:  Review of Systems  Constitutional: Negative.   HENT: Negative.    Eyes: Negative.   Respiratory: Negative.    Cardiovascular: Negative.   Gastrointestinal: Negative.   Genitourinary: Negative.   Musculoskeletal: Negative.   Skin: Negative.   Neurological: Negative.   Endo/Heme/Allergies: Negative.   Psychiatric/Behavioral: Negative.      Objective:   Vitals:   09/24/20 1036  BP: 102/68  Pulse: 74  Resp: 18  Temp: (!) 97.5 F (36.4 C)  SpO2: 96%   Height: 5\' 8"  (172.7 cm)  Weight: 180 lb 6.4 oz (81.8 kg)   Physical Exam Constitutional:      Appearance: She is not diaphoretic.  HENT:     Head: Normocephalic.     Right Ear: Tympanic membrane, ear canal and  external ear normal.     Left Ear: Tympanic membrane, ear canal and external ear normal.     Nose: Nose normal. No mucosal edema or rhinorrhea.     Mouth/Throat:     Pharynx: Uvula midline. No oropharyngeal exudate.  Eyes:     Conjunctiva/sclera: Conjunctivae normal.  Neck:     Thyroid: No thyromegaly.     Trachea: Trachea normal. No tracheal tenderness or tracheal deviation.  Cardiovascular:     Rate and Rhythm: Normal rate and regular rhythm.     Heart sounds: Normal heart sounds, S1 normal and S2 normal. No murmur heard. Pulmonary:     Effort: No respiratory distress.     Breath sounds: Normal breath sounds. No stridor. No wheezing or rales.   Lymphadenopathy:     Head:     Right side of head: No tonsillar adenopathy.     Left side of head: No tonsillar adenopathy.     Cervical: No cervical adenopathy.  Skin:    Findings: No erythema or rash.     Nails: There is no clubbing.  Neurological:     Mental Status: She is alert.    Diagnostics:   The patient had an Asthma Control Test with the following results: ACT Total Score: 20.    Assessment and Plan:   1. Not well controlled mild persistent asthma   2. Other allergic rhinitis   3. LPRD (laryngopharyngeal reflux disease)     1.  Treat and prevent inflammation:   A. Montelukast 10 mg - 1 tablet 1 time per day  B. Flonase - 1-2 sprays each nostril 1 time per day  C. Pulmicort 180 - 2 inhalations 1 time per day  2. Treat and prevent reflux/LPR:   A. Minimize all caffeine consumption  B. Further treatment???  2.  If needed:   A.  Xyzal  B.  Proventil HFA 2 puffs every 4-6 hours  3. Increase Pulmicort 180 3 inhalations 3 times per day for asthma flare up.   4. Return to clinic in 6 months or earlier if problem  5. Plan for fall flu vaccine  Keely appears to have a little bit more activity of her respiratory tract inflammatory state and this appears to correspond with her recent COVID infection February 2022.  I am going to have her consistently use anti-inflammatory agents for her airway with a combination of treatment as noted above.  As well, some of her history is very consistent with LPR and were going to approach this issue very conservatively and asked her to consolidate all caffeine consumption.  Should she continue to have some throat issues she will require further evaluation and treatment.  Assuming she does well with this plan I will see her back in this clinic in 6 months or earlier if there is a problem.  Laurette Schimke, MD Allergy / Immunology [facility]

## 2020-09-24 NOTE — Patient Instructions (Signed)
  1.  Treat and prevent inflammation:   A. Montelukast 10 mg - 1 tablet 1 time per day  B. Flonase - 1-2 sprays each nostril 1 time per day  C. Pulmicort 180 - 2 inhalations 1 time per day  2. Treat and prevent reflux/LPR:   A. Minimize all caffeine consumption  B. Further treatment???  2.  If needed:   A.  Xyzal  B.  Proventil HFA 2 puffs every 4-6 hours  3. Increase Pulmicort 180 3 inhalations 3 times per day for asthma flare up.   4. Return to clinic in 6 months or earlier if problem  5. Plan for fall flu vaccine

## 2020-09-25 ENCOUNTER — Encounter: Payer: Self-pay | Admitting: Allergy and Immunology

## 2020-09-25 MED ORDER — FLUTICASONE PROPIONATE 50 MCG/ACT NA SUSP: NASAL | 5 refills | Status: DC

## 2020-09-25 MED ORDER — MONTELUKAST SODIUM 10 MG PO TABS: 10.0000 mg | ORAL_TABLET | Freq: Every day | ORAL | 5 refills | Status: DC

## 2020-09-25 MED ORDER — LEVOCETIRIZINE DIHYDROCHLORIDE 5 MG PO TABS: 5.0000 mg | ORAL_TABLET | Freq: Every evening | ORAL | 5 refills | Status: DC

## 2020-09-25 MED ORDER — PULMICORT FLEXHALER 180 MCG/ACT IN AEPB: 2.0000 | INHALATION_SPRAY | Freq: Every day | RESPIRATORY_TRACT | 1 refills | Status: DC

## 2020-09-25 MED ORDER — ALBUTEROL SULFATE HFA 108 (90 BASE) MCG/ACT IN AERS: INHALATION_SPRAY | RESPIRATORY_TRACT | 1 refills | Status: DC

## 2020-09-25 NOTE — Addendum Note (Signed)
Addended by: Robet Leu A on: 09/25/2020 02:26 PM   Modules accepted: Orders

## 2021-04-01 ENCOUNTER — Other Ambulatory Visit: Payer: Self-pay

## 2021-04-01 ENCOUNTER — Ambulatory Visit: Payer: 59 | Admitting: Allergy and Immunology

## 2021-04-01 VITALS — BP 118/70 | HR 75 | Temp 98.4°F | Resp 16 | Ht 68.0 in | Wt 174.0 lb

## 2021-04-01 DIAGNOSIS — J4530 Mild persistent asthma, uncomplicated: Secondary | ICD-10-CM | POA: Diagnosis not present

## 2021-04-01 DIAGNOSIS — J069 Acute upper respiratory infection, unspecified: Secondary | ICD-10-CM | POA: Diagnosis not present

## 2021-04-01 DIAGNOSIS — K219 Gastro-esophageal reflux disease without esophagitis: Secondary | ICD-10-CM

## 2021-04-01 DIAGNOSIS — J3089 Other allergic rhinitis: Secondary | ICD-10-CM | POA: Diagnosis not present

## 2021-04-01 MED ORDER — FAMOTIDINE 20 MG PO TABS: 20.0000 mg | ORAL_TABLET | Freq: Two times a day (BID) | ORAL | 5 refills | Status: DC

## 2021-04-01 MED ORDER — GUAIFENESIN ER 600 MG PO TB12: 600.0000 mg | ORAL_TABLET | Freq: Two times a day (BID) | ORAL | 5 refills | Status: DC | PRN

## 2021-04-01 MED ORDER — ALBUTEROL SULFATE HFA 108 (90 BASE) MCG/ACT IN AERS: 2.0000 | INHALATION_SPRAY | RESPIRATORY_TRACT | 2 refills | Status: AC | PRN

## 2021-04-01 MED ORDER — PULMICORT FLEXHALER 180 MCG/ACT IN AEPB: 2.0000 | INHALATION_SPRAY | Freq: Every day | RESPIRATORY_TRACT | 11 refills | Status: AC

## 2021-04-01 MED ORDER — MONTELUKAST SODIUM 10 MG PO TABS: 10.0000 mg | ORAL_TABLET | Freq: Every day | ORAL | 11 refills | Status: DC

## 2021-04-01 MED ORDER — FLUTICASONE PROPIONATE 50 MCG/ACT NA SUSP: 2.0000 | Freq: Every day | NASAL | 11 refills | Status: DC

## 2021-04-01 MED ORDER — LEVOCETIRIZINE DIHYDROCHLORIDE 5 MG PO TABS: 5.0000 mg | ORAL_TABLET | Freq: Two times a day (BID) | ORAL | 11 refills | Status: DC | PRN

## 2021-04-01 NOTE — Patient Instructions (Addendum)
°  1.  Treat and prevent inflammation:   A. Flonase - 1-2 sprays each nostril 1 time per day  B. Pulmicort 180 - 2 inhalations 3-7 times per week  2. If needed:   A.  Xyzal 5 mg - 1 tablet 1 time per day  B.  Proventil HFA 2 puffs every 4-6 hours  3. For this recent episode:  A. Pulmicort 180 3 inhalations 3 times per day for asthma flare up.  B. OTC famotidine 20 mg - 1 tablet 2 times per day C. Mucinex DM - 1-2 tablets 2 times per day  4. Return to clinic in 12 months or earlier if problem

## 2021-04-01 NOTE — Progress Notes (Signed)
[place] - [place] - [place] - [place] - [place]   Follow-up Note  Referring Provider: No ref. provider found Primary Provider: Patient, No Pcp Per (Inactive) Date of Office Visit: 04/01/2021  Subjective:   Lisa Cochran (DOB: 1980-11-18) is a 41 y.o. female who [facility] in re-evaluation of the following:  HPI: Lisa Cochran returns to this clinic in evaluation of asthma and allergic rhinitis and a history of reflux.  I last saw her in this clinic on 24 September 2020.  She had a very good interval of time regarding her asthma.  She does not use her Pulmicort on a consistent basis.  She does rely on the use of albuterol at least several times per week which appears to work quite well regarding some intermittent wheezing.  She can exert herself without any problem.  It does not sound as though she has required a systemic steroid to treat an exacerbation of asthma.  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  She uses a 75M respirator about 80% of the time.  Likewise, she has done very well with her upper airway.  She uses a nasal steroid on a consistent basis.  She does not use any montelukast.  She has not required an antibiotic to treat an episode of sinusitis.  She has not been having any reflux.  Her throat clearing and sensation of lump in her throat has basically resolved.  She has tapered off caffeine.  Last week she contracted a cold with nasal congestion and sore throat and some cough.  Although her nasal congestion and sore throat have resolved she still has a lingering cough.  She has received 4 Pfizer vaccines and this year's flu vaccine.  Allergies as of 04/01/2021   No Known Allergies      Medication List    albuterol 108 (90 Base) MCG/ACT inhaler Commonly known as: VENTOLIN HFA INHALE 2 PUFFS INTO THE LUNGS EVERY 4-6 HOURS AS NEEDED  FOR WHEEZING OR SHORTNESS OF BREATH   fluticasone 50 MCG/ACT nasal spray Commonly known as: FLONASE Use 1 to 2 sprays in each nostril once a day as needed for stuffy nose   levocetirizine 5 MG tablet Commonly known as: XYZAL Take 1 tablet (5 mg total) by mouth every evening.   montelukast 10 MG tablet Commonly known as: SINGULAIR Take 1 tablet (10 mg total) by mouth at bedtime.   Pulmicort Flexhaler 180 MCG/ACT inhaler Generic drug: budesonide Inhale 2 puffs into the lungs daily.    Past Medical History:  Diagnosis Date   Asthma     Past Surgical History:  Procedure Laterality Date   APPENDECTOMY     LASIK     PLACEMENT OF BREAST IMPLANTS      Review of systems negative except as noted in HPI / PMHx or noted below:  Review of Systems  Constitutional: Negative.   HENT: Negative.    Eyes: Negative.   Respiratory: Negative.    Cardiovascular: Negative.   Gastrointestinal: Negative.   Genitourinary: Negative.   Musculoskeletal: Negative.   Skin: Negative.   Neurological: Negative.   Endo/Heme/Allergies: Negative.   Psychiatric/Behavioral: Negative.      Objective:   Vitals:   04/01/21 1130  BP: 118/70  Pulse: 75  Resp: 16  Temp: 98.4 F (36.9 C)  SpO2: 99%   Height: 5\' 8"  (172.7 cm)  Weight: 174 lb (78.9 kg)   Physical Exam Constitutional:      Appearance: She is not diaphoretic.  HENT:     Head: Normocephalic.     Right Ear: Tympanic membrane, ear canal and external ear normal.     Left Ear: Tympanic membrane, ear canal and external ear normal.     Nose: Nose normal. No mucosal edema or rhinorrhea.     Mouth/Throat:     Pharynx: Uvula midline. No oropharyngeal exudate.  Eyes:     Conjunctiva/sclera: Conjunctivae normal.  Neck:     Thyroid: No thyromegaly.     Trachea: Trachea normal. No tracheal tenderness or tracheal deviation.  Cardiovascular:     Rate and Rhythm: Normal rate and regular rhythm.     Heart sounds: Normal heart sounds, S1  normal and S2 normal. No murmur heard. Pulmonary:     Effort: No respiratory distress.     Breath sounds: Normal breath sounds. No stridor. No wheezing or rales.  Lymphadenopathy:     Head:     Right side of head: No tonsillar adenopathy.     Left side of head: No tonsillar adenopathy.     Cervical: No cervical adenopathy.  Skin:    Findings: No erythema or rash.     Nails: There is no clubbing.  Neurological:     Mental Status: She is alert.    Diagnostics:    Spirometry was performed and demonstrated an FEV1 of 2.99 at 87 % of predicted.  The patient had an Asthma Control Test with the following results: ACT Total Score: 22.    Assessment and Plan:   1. Asthma, well controlled, mild persistent   2. Other allergic rhinitis   3. LPRD (laryngopharyngeal reflux disease)   4. Viral upper respiratory tract infection with cough     1.  Treat and prevent inflammation:   A. Flonase - 1-2 sprays each nostril 1 time per day  B. Pulmicort 180 - 2 inhalations 3-7 times per week  2. If needed:   A.  Xyzal 5 mg - 1 tablet 1 time per day  B.  Proventil HFA 2 puffs every 4-6 hours  3. For this recent episode:  A. Pulmicort 180 3 inhalations 3 times per day for asthma flare up.  B. OTC famotidine 20 mg - 1 tablet 2 times per day C. Mucinex DM - 1-2 tablets 2 times per day  4. Return to clinic in 12 months or earlier if problem  Lisa Cochran appears to have had a recent viral respiratory tract infection and she has some lingering cough from this issue and given the fact that she has had a history of some reflux induced respiratory disease and has a history of atopic respiratory disease I am pretty sure that if she uses a combination of therapy directed against both of these issues she should resolve her cough in a very short period in time.  She has done pretty well regarding her upper airway issue while using Flonase on a consistent basis.  She still has a relatively high requirement for a  short acting bronchodilator while she is not using any inhaled steroids and I have encouraged her to use some dose of inhaled steroids to get her asthma under better control than it is currently on a chronic basis.  She appears to understand her disease state and how medications work and appropriate dosing of medications.  I will see her back in this clinic in 1 year or  earlier if there is a problem.  Allena Katz, MD Allergy / Immunology [place]

## 2021-04-02 ENCOUNTER — Encounter: Payer: Self-pay | Admitting: Allergy and Immunology

## 2021-05-28 ENCOUNTER — Ambulatory Visit: Payer: 59 | Admitting: Podiatry

## 2021-05-28 ENCOUNTER — Other Ambulatory Visit: Payer: Self-pay

## 2021-05-28 DIAGNOSIS — L600 Ingrowing nail: Secondary | ICD-10-CM

## 2021-05-28 MED ORDER — GENTAMICIN SULFATE 0.1 % EX CREA: 1.0000 "application " | TOPICAL_CREAM | Freq: Two times a day (BID) | CUTANEOUS | 1 refills | Status: DC

## 2021-05-28 NOTE — Progress Notes (Signed)
? ?  Subjective: ?Patient presents today for evaluation of pain to the right hallux.  Patient states that she does have a history of injury to the right hallux and an old nail is growing on top of the new nail growth.  This occurred April 2022. Patient is concerned for possible ingrown nail.  It is very sensitive to touch.  Patient presents today for further treatment and evaluation. ? ?Past Medical History:  ?Diagnosis Date  ? Asthma   ? ? ?Objective:  ?General: Well developed, nourished, in no acute distress, alert and oriented x3  ? ?Dermatology: Skin is warm, dry and supple bilateral.  Medial border of the right hallux nail plate appears to be erythematous with evidence of an ingrowing nail. Pain on palpation noted to the border of the nail fold. The remaining nails appear unremarkable at this time. There are no open sores, lesions. ? ?Vascular: Dorsalis Pedis artery and Posterior Tibial artery pedal pulses palpable. No lower extremity edema noted.  ? ?Neruologic: Grossly intact via light touch bilateral. ? ?Musculoskeletal: Muscular strength within normal limits in all groups bilateral. Normal range of motion noted to all pedal and ankle joints.  ? ?Assesement: ?#1 Paronychia with ingrowing nail medial border right hallux nail plate ?#2 Pain in toe ? ?Plan of Care:  ?1. Patient evaluated.  ?2. Discussed treatment alternatives and plan of care. Explained nail avulsion procedure and post procedure course to patient. ?3. Patient opted for permanent partial nail avulsion of the ingrown portion of the nail.  ?4. Prior to procedure, local anesthesia infiltration utilized using 3 ml of a 50:50 mixture of 2% plain lidocaine and 0.5% plain marcaine in a normal hallux block fashion and a betadine prep performed.  ?5. Partial permanent nail avulsion with chemical matrixectomy performed using [id number] applications of phenol followed by alcohol flush.  ?6. Light dressing applied.  Post care instructions provided ?7.   Prescription for gentamicin 2% cream  ?8.  Return to clinic 2 weeks. ? ?Edrick Kins, DPM ?[facility] ? ?Dr. Edrick Kins, DPM  ?  ?[address]                                       ?[place], [place] [address]                ?Office [phone]  ?Fax [phone] ? ? ? ? ?

## 2021-06-11 ENCOUNTER — Other Ambulatory Visit: Payer: Self-pay

## 2021-06-11 ENCOUNTER — Ambulatory Visit: Payer: 59 | Admitting: Podiatry

## 2021-06-11 DIAGNOSIS — L600 Ingrowing nail: Secondary | ICD-10-CM | POA: Diagnosis not present

## 2021-06-11 NOTE — Progress Notes (Signed)
? ?  Subjective: ?41 y.o. female presents today status post permanent nail avulsion procedure of the right great toe medial border that was performed on 05/28/2021.  Patient states that she is feeling much better.  She did have some pain and tenderness associated to the toe for about 4 days after surgery.  She remained very active and she would have pain with activity.  ? ?Past Medical History:  ?Diagnosis Date  ? Asthma   ? ? ?Objective: ?Skin is warm, dry and supple. Nail and respective nail fold appears to be healing appropriately.  Minimal drainage noted.  There is no real erythema around the periungual region. ? ?Assessment: ?#1 s/p partial permanent nail matrixectomy medial border right great toe ? ? ?Plan of care: ?#1 patient was evaluated  ?#2 light debridement of open wound was performed to the periungual border of the respective toe using a currette. Antibiotic ointment and Band-Aid was applied. ?#3 patient is to return to clinic on a PRN basis. ? ? ?Felecia Shelling, DPM ?[facility] ? ?Dr. Felecia Shelling, DPM  ?  ?[address]                                      ?[place], [place] [address]                ?Office [phone]  ?Fax [phone] ? ? ? ? ?

## 2021-08-31 IMAGING — MR MR BRAIN/IAC WO/W CM
12 series · 38 of 48 positions shown · IV contrast (multihance)
Comparison: None.

CLINICAL DATA: Left-sided hearing loss and tinnitus over the last 2
years. History of shingles.

EXAM:
MRI HEAD WITHOUT AND WITH CONTRAST
TECHNIQUE: Multiplanar, multiecho pulse sequences of the brain and surrounding
structures were obtained without and with intravenous contrast.
CONTRAST:  20mL MULTIHANCE GADOBENATE DIMEGLUMINE 529 MG/ML IV SOLN

[Series 2: T1 · sagittal · 5.0mm · 0.45mm/px · 3 of 23 slices shown (1 of 4)]
[im 1/23]
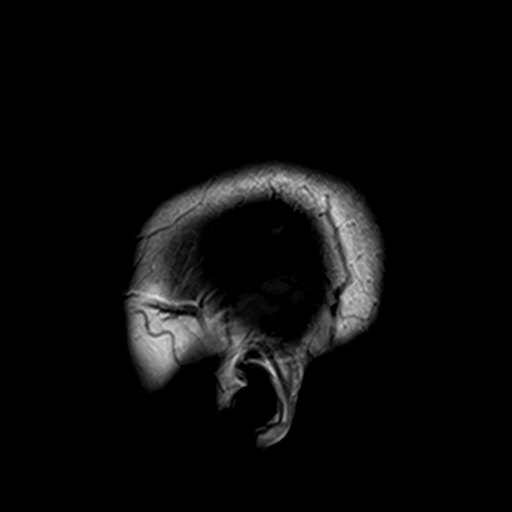
[im 12/23]
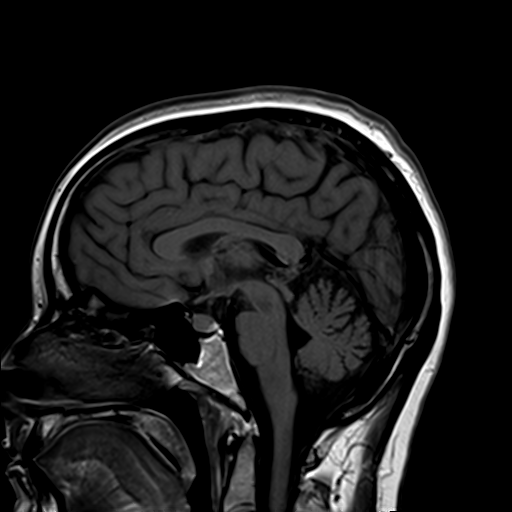
[im 23/23]
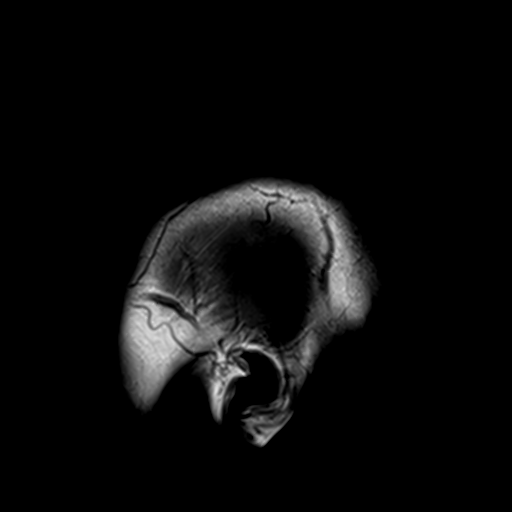

[Series 3: ep2d_diff_3 · axial · 3.0mm · 1.80mm/px · z∈[-68,+79]mm · 8 of 100 slices shown]
[im 1/100]
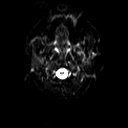
[im 20/100]
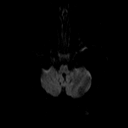
[im 30/100]
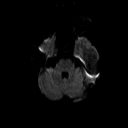
[im 40/100]
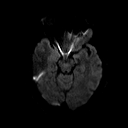
[im 60/100]
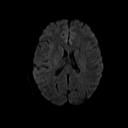
[im 70/100]
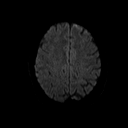
[im 80/100]
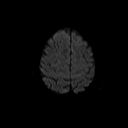
[im 100/100]
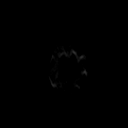

[Series 4: ep2d_diff_3_adc · axial · 3.0mm · 1.80mm/px · z∈[-68,+79]mm · 5 of 48 slices shown]
[im 1/48]
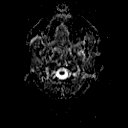
[im 12/48]
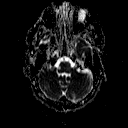
[im 24/48]
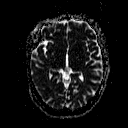
[im 36/48]
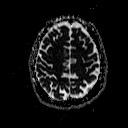
[im 48/48]
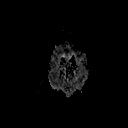

[Series 5: t2_tse_tra_512 · axial · 5.0mm · 0.60mm/px · z∈[-63,+80]mm · 2 of 24 slices shown]
[im 1/24]
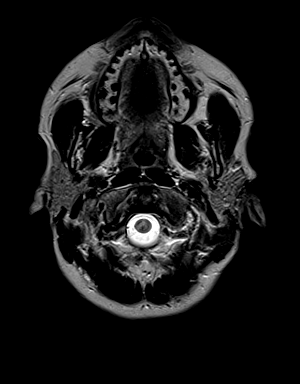
[im 24/24]
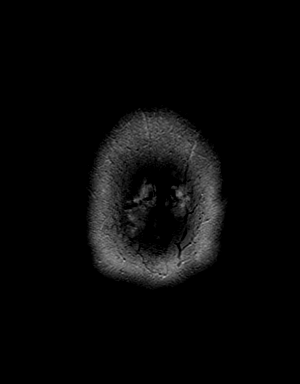

[Series 7: swi_images · axial · 2.0mm · 0.90mm/px · z∈[-71,+86]mm · 8 of 80 slices shown]
[im 1/80]
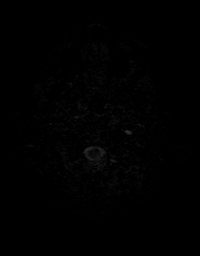
[im 12/80]
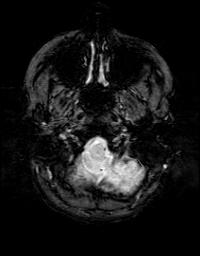
[im 23/80]
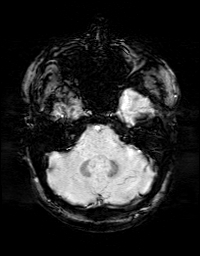
[im 34/80]
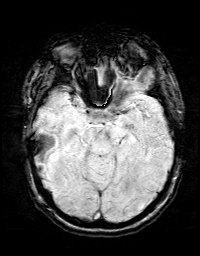
[im 46/80]
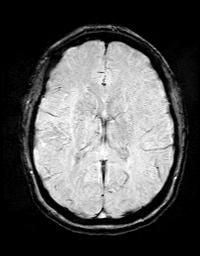
[im 57/80]
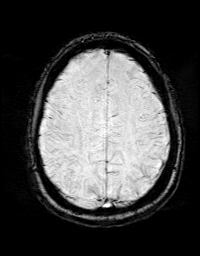
[im 68/80]
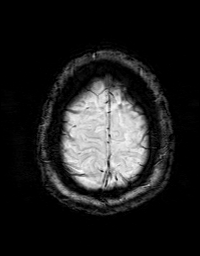
[im 80/80]
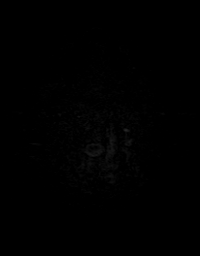

[Series 8: FLAIR · axial · 3.0mm · 0.43mm/px · z∈[-67,+89]mm · 3 of 27 slices shown]
[im 1/27]
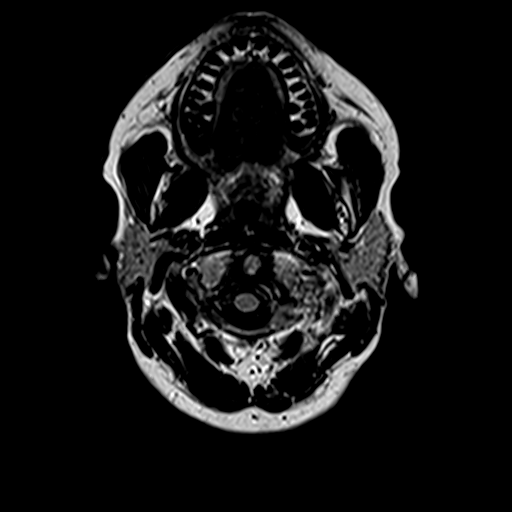
[im 14/27]
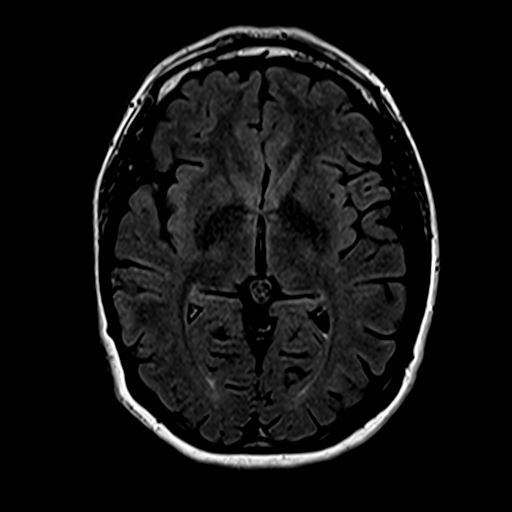
[im 27/27]
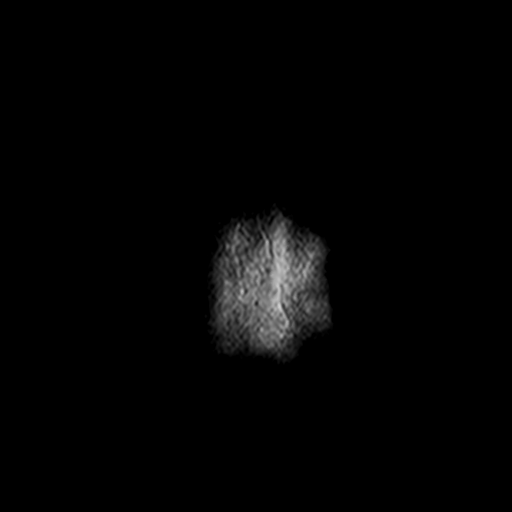

[Series 9: T1 · coronal · 3.0mm · 0.35mm/px · 1 of 11 slices shown (2 of 4)]
[im 1/11]
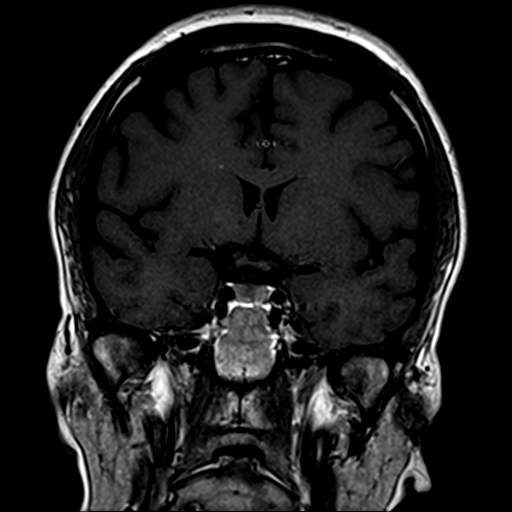

[Series 10: T1 · axial · 3.0mm · 0.35mm/px · 1 of 11 slices shown (3 of 4)]
[im 1/11]
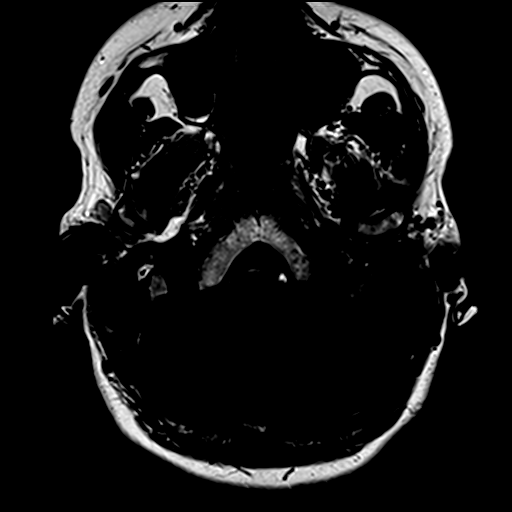

[Series 11: bSSFP · axial · 0.7mm · 0.28mm/px · z∈[-43,-13]mm · 4 of 44 slices shown]
[im 1/44]
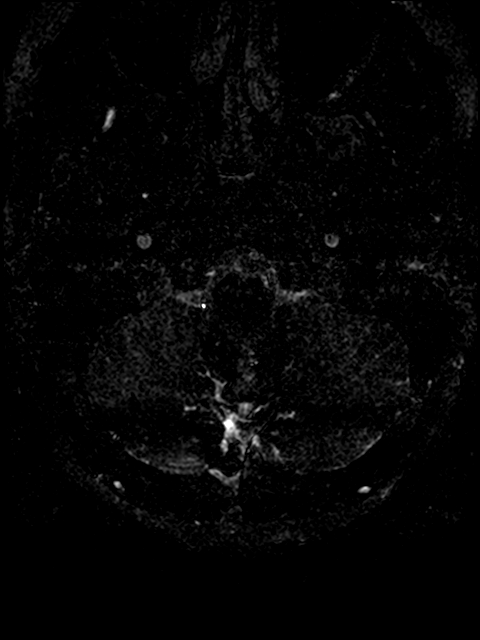
[im 15/44]
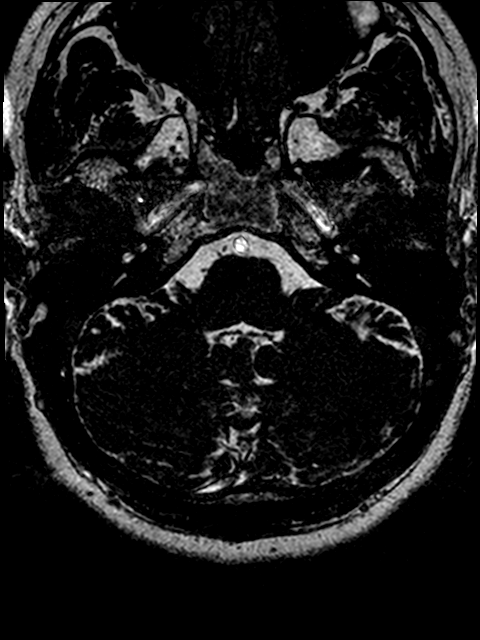
[im 29/44]
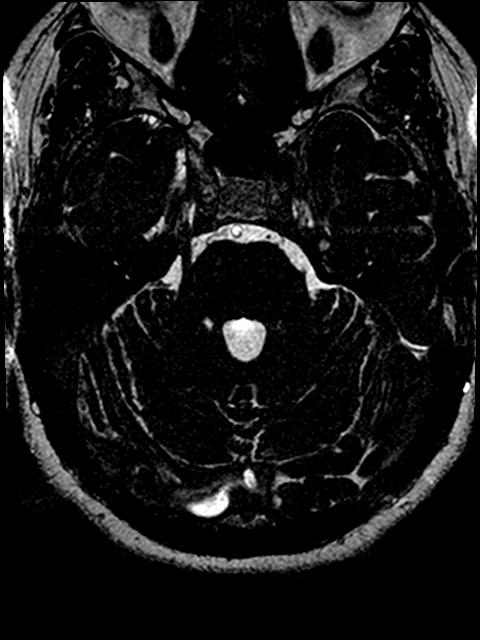
[im 44/44]
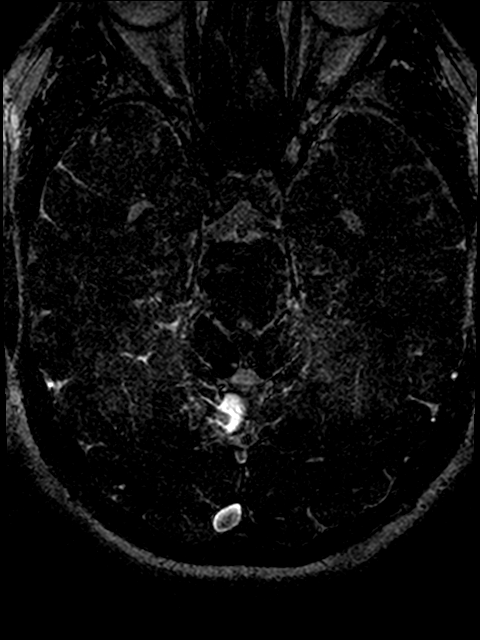

[Series 12: T1 · coronal · 3.0mm · 0.35mm/px · 1 of 11 slices shown (4 of 4)]
[im 1/11]
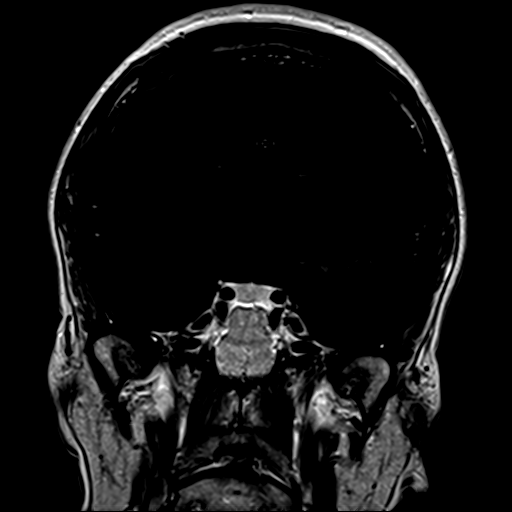

[Series 13: T1 post-contrast · axial · 3.0mm · 0.35mm/px · 1 of 11 slices shown]
[im 1/11]
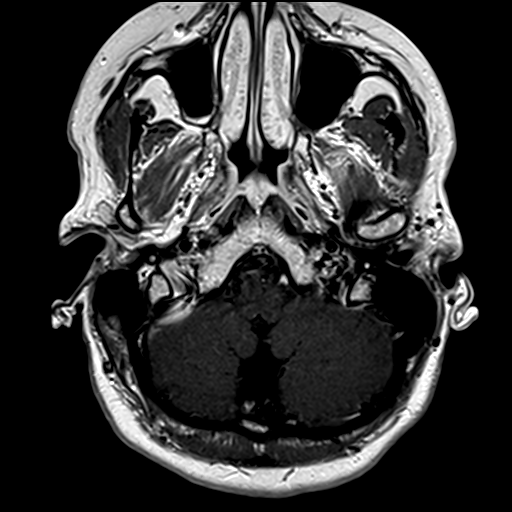

[Series 14: axial (person_name) · axial · 2.0mm · 0.45mm/px · 1 of 80 slices shown]
[im 1/80]
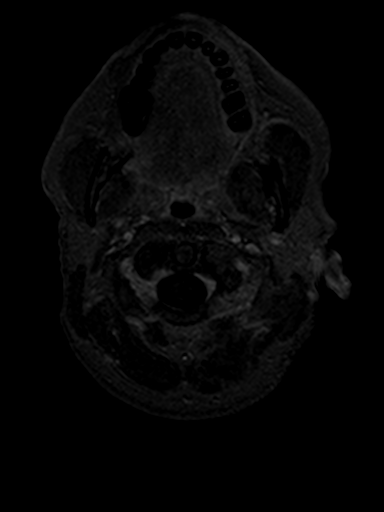

[38 of 48 positions shown; findings below may reference images not displayed]

FINDINGS: Brain: The brain has a normal appearance without evidence of
malformation, atrophy, old or acute small or large vessel
infarction, mass lesion, hemorrhage, hydrocephalus or extra-axial
collection.

Vascular: Major vessels at the base of the brain show flow. Venous
sinuses appear patent.

Skull and upper cervical spine: Normal.

Sinuses/Orbits: Clear/normal.

Other: None significant.  No fluid in the middle ears or mastoids.
IMPRESSION: Normal examination. No abnormality seen to explain left-sided
hearing loss and tinnitus.

## 2021-09-19 ENCOUNTER — Ambulatory Visit: Payer: BC Managed Care – PPO | Admitting: Podiatry

## 2021-09-19 ENCOUNTER — Encounter: Payer: Self-pay | Admitting: Podiatry

## 2021-09-19 DIAGNOSIS — L03031 Cellulitis of right toe: Secondary | ICD-10-CM | POA: Diagnosis not present

## 2021-09-19 MED ORDER — DOXYCYCLINE HYCLATE 100 MG PO TABS: 100.0000 mg | ORAL_TABLET | Freq: Two times a day (BID) | ORAL | 1 refills | Status: DC

## 2021-11-23 ENCOUNTER — Other Ambulatory Visit: Payer: Self-pay | Admitting: Allergy and Immunology

## 2022-04-27 ENCOUNTER — Other Ambulatory Visit: Payer: Self-pay | Admitting: Allergy and Immunology

## 2022-05-23 ENCOUNTER — Other Ambulatory Visit: Payer: Self-pay | Admitting: Allergy and Immunology

## 2022-06-17 ENCOUNTER — Other Ambulatory Visit: Payer: Self-pay | Admitting: Allergy and Immunology

## 2022-06-18 ENCOUNTER — Other Ambulatory Visit: Payer: Self-pay | Admitting: *Deleted

## 2022-06-18 MED ORDER — FAMOTIDINE 20 MG PO TABS: 20.0000 mg | ORAL_TABLET | Freq: Two times a day (BID) | ORAL | 0 refills | Status: DC

## 2022-06-18 MED ORDER — LEVOCETIRIZINE DIHYDROCHLORIDE 5 MG PO TABS: 5.0000 mg | ORAL_TABLET | Freq: Two times a day (BID) | ORAL | 0 refills | Status: DC | PRN

## 2022-07-07 ENCOUNTER — Ambulatory Visit: Payer: BC Managed Care – PPO | Admitting: Allergy and Immunology

## 2022-07-07 VITALS — BP 126/82 | HR 66 | Temp 99.1°F | Resp 16 | Ht 68.5 in | Wt 183.8 lb

## 2022-07-07 DIAGNOSIS — K219 Gastro-esophageal reflux disease without esophagitis: Secondary | ICD-10-CM

## 2022-07-07 DIAGNOSIS — J4530 Mild persistent asthma, uncomplicated: Secondary | ICD-10-CM

## 2022-07-07 DIAGNOSIS — J3089 Other allergic rhinitis: Secondary | ICD-10-CM | POA: Diagnosis not present

## 2022-07-07 MED ORDER — PANTOPRAZOLE SODIUM 40 MG PO TBEC: 40.0000 mg | DELAYED_RELEASE_TABLET | Freq: Every morning | ORAL | 3 refills | Status: DC

## 2022-07-07 MED ORDER — FLUTICASONE PROPIONATE 50 MCG/ACT NA SUSP: 2.0000 | Freq: Every day | NASAL | 3 refills | Status: DC

## 2022-07-07 MED ORDER — FAMOTIDINE 40 MG PO TABS: 40.0000 mg | ORAL_TABLET | Freq: Every evening | ORAL | 3 refills | Status: DC

## 2022-07-07 MED ORDER — LEVOCETIRIZINE DIHYDROCHLORIDE 5 MG PO TABS: 5.0000 mg | ORAL_TABLET | Freq: Two times a day (BID) | ORAL | 1 refills | Status: DC | PRN

## 2022-07-07 MED ORDER — AIRSUPRA 90-80 MCG/ACT IN AERO: 2.0000 | INHALATION_SPRAY | RESPIRATORY_TRACT | 1 refills | Status: DC | PRN

## 2022-07-07 NOTE — Progress Notes (Unsigned)
Lisa Cochran - Lisa Cochran - Lisa Cochran - Lisa Cochran - Lisa Cochran   Follow-up Note  Referring Provider: No ref. provider found Primary Provider: Patient, No Pcp Per Date of Office Visit: 07/07/2022  Subjective:   Lisa Cochran (DOB: [birth year]) is a 42 y.o. female who [facility] 07[facility]/2022 in re-evaluation of the following:  HPI: Pheonix returns to this clinic in evaluation of asthma, allergic rhinitis, reflux.  I last saw her in this clinic 01 April 2021.  Her asthma has been relatively nonexistent with minimal use of a short acting bronchodilator no need to use a systemic steroid to treat an exacerbation without the consistent use of using her Pulmicort.  Her nose has been doing very well.  She uses Flonase during the pollination season which is working well.  She also takes Xyzal which also helps her symptoms involving her nose.  She has had some more problems with classic reflux symptoms with heartburn and regurgitation while using her famotidine at 20 mg once a day.  She is taken some Tums as well.  She has not really changed her caffeine consumption.  Sounds as though she drinks about 12 ounces of caffeine drink per day.  She has not started any new medications that may be precipitating reflux.  She has not really been having a lot of throat issues tied up with her LPR even though she is having more classic reflux symptoms.  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  She takes Xyzal every night which definitely helps her fractured sleep and in fact she sleeps through the night after taking half of his eyes all.  Allergies as of 07/07/2022   No Known Allergies      Medication List    acyclovir 800 MG tablet Commonly known as: ZOVIRAX TAKE 1 TO 2 TABLETS AT BEDTIME   albuterol 108 (90 Base) MCG/ACT inhaler Commonly known as: VENTOLIN HFA Inhale 2 puffs into the lungs every 4 (four) hours as needed for wheezing or shortness of breath.  INHALE 2 PUFFS INTO THE LUNGS EVERY 4-6 HOURS AS NEEDED FOR WHEEZING OR SHORTNESS OF BREATH   [employer] SUNSCREEN SPF 30 EX apply   [place]-28 50-30/75-40/ 125-30 MCG Tabs Generic drug: Levonorg-Eth Estrad Triphasic Take 1 tablet by mouth daily.   famotidine 20 MG tablet Commonly known as: PEPCID Take 1 tablet (20 mg total) by mouth 2 (two) times daily.   fluticasone 50 MCG/ACT nasal spray Commonly known as: FLONASE Place 2 sprays into both nostrils daily. Use 1 to 2 sprays in each nostril once a day as needed for stuffy nose   levocetirizine 5 MG tablet Commonly known as: XYZAL Take 1 tablet (5 mg total) by mouth 2 (two) times daily as needed for allergies (can use an extra dose during flare ups.).   Pulmicort Flexhaler 180 MCG/ACT inhaler Generic drug: budesonide Inhale 2 puffs into the lungs daily.    Past Medical History:  Diagnosis Date   Asthma     Past Surgical History:  Procedure Laterality Date   APPENDECTOMY     LASIK     PLACEMENT OF BREAST IMPLANTS      Review of systems negative except as noted in HPI / PMHx or noted below:  Review of Systems  Constitutional: Negative.   HENT: Negative.    Eyes: Negative.   Respiratory: Negative.    Cardiovascular: Negative.   Gastrointestinal: Negative.   Genitourinary: Negative.   Musculoskeletal: Negative.  Skin: Negative.   Neurological: Negative.   Endo/Heme/Allergies: Negative.   Psychiatric/Behavioral: Negative.       Objective:   Vitals:   07/07/22 0842  BP: 126/82  Pulse: 66  Resp: 16  Temp: 99.1 F (37.3 C)  SpO2: 98%   Height: 5' 8.5" (174 cm)  Weight: 183 lb 12.8 oz (83.4 kg)   Physical Exam Constitutional:      Appearance: She is not diaphoretic.  HENT:     Head: Normocephalic.     Right Ear: Tympanic membrane, ear canal and external ear normal.     Left Ear: Tympanic membrane, ear canal and external ear normal.     Nose: Nose normal. No mucosal edema or rhinorrhea.     Mouth/Throat:      Pharynx: Uvula midline. No oropharyngeal exudate.  Eyes:     Conjunctiva/sclera: Conjunctivae normal.  Neck:     Thyroid: No thyromegaly.     Trachea: Trachea normal. No tracheal tenderness or tracheal deviation.  Cardiovascular:     Rate and Rhythm: Normal rate and regular rhythm.     Heart sounds: Normal heart sounds, S1 normal and S2 normal. No murmur heard. Pulmonary:     Effort: No respiratory distress.     Breath sounds: Normal breath sounds. No stridor. No wheezing or rales.  Lymphadenopathy:     Head:     Right side of head: No tonsillar adenopathy.     Left side of head: No tonsillar adenopathy.     Cervical: No cervical adenopathy.  Skin:    Findings: No erythema or rash.     Nails: There is no clubbing.  Neurological:     Mental Status: She is alert.     Diagnostics:    Spirometry was performed and demonstrated an FEV1 of 3.13 at 92 % of predicted.   Assessment and Plan:   1. Asthma, well controlled, mild persistent   2. Other allergic rhinitis   3. LPRD (laryngopharyngeal reflux disease)    1.  Treat and prevent inflammation:   A. Flonase - 1-2 sprays each nostril 1 time per day  2. Treat and prevent reflux:   A. Minimize caffeine consumption  B. Pantoprazole 40 mg - 1 tab in AM  C. Famotidine 40 mg - 1 tab in PM  D. Nissen fundoplication???  3. If needed:   A.  Xyzal 5 mg - 1 tablet 1 time per day  B.  Airsupra -  2 inhalations every 4-6 hours (empty lungs)  4. Return to clinic in 12 months or earlier if problem  Lisa Cochran appears to be doing very well regarding her respiratory tract and I have given her a combination albuterol/budesonide inhaler to be used as needed as her anti-inflammatory rescue agents.  And she can continue on Flonase to address her upper airway inflammatory condition.  Her reflux is a little more active and I made the recommendation noted above to address this issue.  I have given her the name of a Nissen fundoplication to  research if she fails medical treatment.  Laurette Schimke, MD Allergy / Immunology [facility]

## 2022-07-07 NOTE — Patient Instructions (Addendum)
  1.  Treat and prevent inflammation:   A. Flonase - 1-2 sprays each nostril 1 time per day  2. Treat and prevent reflux:   A. Minimize caffeine consumption  B. Pantoprazole 40 mg - 1 tab in AM  C. Famotidine 40 mg - 1 tab in PM  D. Nissen fundoplication???  3. If needed:   A.  Xyzal 5 mg - 1 tablet 1 time per day  B.  Airsupra -  2 inhalations every 4-6 hours (empty lungs)  4. Return to clinic in 12 months or earlier if problem

## 2022-07-08 ENCOUNTER — Encounter: Payer: Self-pay | Admitting: Allergy and Immunology

## 2022-07-18 ENCOUNTER — Other Ambulatory Visit: Payer: Self-pay | Admitting: Allergy and Immunology

## 2022-07-20 ENCOUNTER — Encounter: Payer: Self-pay | Admitting: Allergy and Immunology

## 2022-12-10 ENCOUNTER — Other Ambulatory Visit (HOSPITAL_COMMUNITY): Payer: Self-pay

## 2022-12-10 MED ORDER — LISDEXAMFETAMINE DIMESYLATE 30 MG PO CAPS
30.0000 mg | ORAL_CAPSULE | Freq: Every morning | ORAL | 0 refills | Status: AC
Start: 2022-12-10 — End: ?
  Filled 2022-12-10 – 2022-12-11 (×2): qty 30, 30d supply, fill #0

## 2022-12-11 ENCOUNTER — Other Ambulatory Visit (HOSPITAL_COMMUNITY): Payer: Self-pay

## 2023-07-04 ENCOUNTER — Other Ambulatory Visit: Payer: Self-pay | Admitting: Allergy and Immunology

## 2023-08-22 ENCOUNTER — Other Ambulatory Visit: Payer: Self-pay | Admitting: Allergy and Immunology

## 2023-09-21 ENCOUNTER — Encounter: Payer: Self-pay | Admitting: Allergy and Immunology

## 2023-09-21 ENCOUNTER — Other Ambulatory Visit: Payer: Self-pay

## 2023-09-21 ENCOUNTER — Ambulatory Visit: Payer: Self-pay | Admitting: Allergy and Immunology

## 2023-09-21 VITALS — BP 116/80 | HR 80 | Temp 98.2°F | Ht 67.0 in | Wt 182.4 lb

## 2023-09-21 DIAGNOSIS — K219 Gastro-esophageal reflux disease without esophagitis: Secondary | ICD-10-CM

## 2023-09-21 DIAGNOSIS — J4530 Mild persistent asthma, uncomplicated: Secondary | ICD-10-CM

## 2023-09-21 DIAGNOSIS — J3089 Other allergic rhinitis: Secondary | ICD-10-CM

## 2023-09-21 MED ORDER — PANTOPRAZOLE SODIUM 40 MG PO TBEC: 40.0000 mg | DELAYED_RELEASE_TABLET | Freq: Every morning | ORAL | 3 refills | Status: AC

## 2023-09-21 MED ORDER — FLUTICASONE PROPIONATE 50 MCG/ACT NA SUSP: 2.0000 | Freq: Every day | NASAL | 3 refills | Status: AC

## 2023-09-21 MED ORDER — AIRSUPRA 90-80 MCG/ACT IN AERO: 2.0000 | INHALATION_SPRAY | RESPIRATORY_TRACT | 1 refills | Status: AC | PRN

## 2023-09-21 MED ORDER — LEVOCETIRIZINE DIHYDROCHLORIDE 5 MG PO TABS: 5.0000 mg | ORAL_TABLET | Freq: Every day | ORAL | 3 refills | Status: AC | PRN

## 2023-09-21 MED ORDER — FAMOTIDINE 40 MG PO TABS: 40.0000 mg | ORAL_TABLET | Freq: Every evening | ORAL | 3 refills | Status: AC

## 2023-09-21 NOTE — Patient Instructions (Addendum)
  1.  Treat and prevent inflammation:   A. Flonase  - 1-2 sprays each nostril 3-7 times per week  2. Treat and prevent reflux:   A. Minimize caffeine consumption  B. Pantoprazole  40 mg - 1 tab in AM  C. Famotidine  40 mg - 1 tab in PM IF NEEDED  3. If needed:   A.  Xyzal  5 mg - 1 tablet 1 time per day  B.  Airsupra  -  2 inhalations every 4-6 hours (empty lungs)  4. Return to clinic in 12 months or earlier if problem  5. Influenza = Tamiflu. Covid = Paxlovid

## 2023-09-21 NOTE — Progress Notes (Unsigned)
 [facility] [place] [facility] [place] - [place] - [place]   Follow-up Note  Referring Provider: No ref. provider found Primary Provider: Patient, No Pcp Per Date of Office Visit: 09/21/2023  Subjective:   Lavonda Thal (DOB: Dec 26, 1980) is a 43 y.o. female who [facility] in re-evaluation of the following:  HPI: Amenda returns to this clinic in evaluation of asthma, allergic rhinitis, reflux.  I last saw in this clinic 07 July 2022.  Overall she has done pretty well with her airway over the course of the past year.  She apparently did develop a viral respiratory tract infection with cough and sore throat and all of her viral swabs were negative so she was given a cough suppressant and an antibiotic but Fortuner that issue resolved.  Other than that event for use of a short acting bronchodilators less than once month and she has no exercise-induced bronchospastic symptoms and no cold air induced bronchospastic symptoms.  Her upper airway is doing well using Flonase  intermittently.  Apparently she did develop some pain in her nose and she went see his dermatologist and there dermatologist treated her with mupirocin in her nose for few weeks and fortunately that issue is resolved.  Her reflux is still an active issue if she does not use her pantoprazole .  She was off pantoprazole  for a month and she was having reflux up into her throat that was horrendous.  She is now back on pantoprazole .  Occasionally she will use some famotidine  in the evening.  Allergies as of 09/21/2023   No Known Allergies      Medication List    acyclovir 800 MG tablet Commonly known as: ZOVIRAX TAKE 1 TO 2 TABLETS AT BEDTIME What changed: See the new instructions.   Airsupra  90-80 MCG/ACT Aero Generic drug: Albuterol -Budesonide  Inhale 2 puffs into the lungs every 4 (four) hours as needed.   albuterol  108 (90 Base) MCG/ACT inhaler Commonly known as: VENTOLIN   HFA Inhale 2 puffs into the lungs every 4 (four) hours as needed for wheezing or shortness of breath. INHALE 2 PUFFS INTO THE LUNGS EVERY 4-6 HOURS AS NEEDED FOR WHEEZING OR SHORTNESS OF BREATH   [employer] SUNSCREEN SPF 30 EX apply   [place]-28 50-30/75-40/ 125-30 MCG Tabs Generic drug: Levonorg-Eth Estrad Triphasic Take 1 tablet by mouth daily.   famotidine  40 MG tablet Commonly known as: PEPCID  Take 1 tablet (40 mg total) by mouth at bedtime.   FLUoxetine 40 MG capsule Commonly known as: PROZAC Take 40 mg by mouth daily.   FLUoxetine 10 MG tablet Commonly known as: PROZAC Take 10 mg by mouth daily.   fluticasone  50 MCG/ACT nasal spray Commonly known as: FLONASE  Place 2 sprays into both nostrils daily. Use 1 to 2 sprays in each nostril once a day as needed for stuffy nose   levocetirizine 5 MG tablet Commonly known as: XYZAL  Take 1 tablet (5 mg total) by mouth 2 (two) times daily as needed for allergies (can use an extra dose during flare ups.).   lisdexamfetamine 30 MG capsule Commonly known as: VYVANSE  Take 1 capsule (30 mg total) by mouth in the morning.   NuvaRing 0.12-0.015 MG/24HR vaginal ring Generic drug: etonogestrel-ethinyl estradiol Place 1 each vaginally every 28 (twenty-eight) days.   pantoprazole  40 MG tablet Commonly known as: PROTONIX  TAKE 1 TABLET (40 MG TOTAL) BY MOUTH IN THE MORNING   Pulmicort  Flexhaler 180 MCG/ACT inhaler Generic drug: budesonide  Inhale 2 puffs into the  lungs daily.    Past Medical History:  Diagnosis Date   Asthma     Past Surgical History:  Procedure Laterality Date   APPENDECTOMY     LASIK     PLACEMENT OF BREAST IMPLANTS      Review of systems negative except as noted in HPI / PMHx or noted below:  Review of Systems  Constitutional: Negative.   HENT: Negative.    Eyes: Negative.   Respiratory: Negative.    Cardiovascular: Negative.   Gastrointestinal: Negative.   Genitourinary: Negative.   Musculoskeletal:  Negative.   Skin: Negative.   Neurological: Negative.   Endo/Heme/Allergies: Negative.   Psychiatric/Behavioral: Negative.       Objective:   There were no vitals filed for this visit.        Physical Exam Constitutional:      Appearance: She is not diaphoretic.  HENT:     Head: Normocephalic.     Right Ear: Tympanic membrane, ear canal and external ear normal.     Left Ear: Tympanic membrane, ear canal and external ear normal.     Nose: Nose normal. No mucosal edema or rhinorrhea.     Mouth/Throat:     Pharynx: Uvula midline. No oropharyngeal exudate.   Eyes:     Conjunctiva/sclera: Conjunctivae normal.   Neck:     Thyroid: No thyromegaly.     Trachea: Trachea normal. No tracheal tenderness or tracheal deviation.   Cardiovascular:     Rate and Rhythm: Normal rate and regular rhythm.     Heart sounds: Normal heart sounds, S1 normal and S2 normal. No murmur heard. Pulmonary:     Effort: No respiratory distress.     Breath sounds: Normal breath sounds. No stridor. No wheezing or rales.  Lymphadenopathy:     Head:     Right side of head: No tonsillar adenopathy.     Left side of head: No tonsillar adenopathy.     Cervical: No cervical adenopathy.   Skin:    Findings: No erythema or rash.     Nails: There is no clubbing.   Neurological:     Mental Status: She is alert.     Diagnostics:    Spirometry was performed and demonstrated an FEV1 of 3.29 at 98 % of predicted.  The patient had an Asthma Control Test with the following results:  .    Assessment and Plan:   1. Asthma, well controlled, mild persistent   2. Other allergic rhinitis   3. LPRD (laryngopharyngeal reflux disease)    1.  Treat and prevent inflammation:   A. Flonase  - 1-2 sprays each nostril 3-7 times per week  2. Treat and prevent reflux:   A. Minimize caffeine consumption  B. Pantoprazole  40 mg - 1 tab in AM  C. Famotidine  40 mg - 1 tab in PM IF NEEDED  3. If needed:   A.  Xyzal   5 mg - 1 tablet 1 time per day  B.  Airsupra  -  2 inhalations every 4-6 hours (empty lungs)  4. Return to clinic in 12 months or earlier if problem  5. Influenza = Tamiflu. Covid = Paxlovid  Emilie appears to be doing pretty well on her current plan which includes as needed nasal steroids and consistent use of her proton pump inhibitor and an occasional H2 receptor blocker and as needed use of Xyzal  on Airsupra .  I have refilled all of her medications and as long as she does well with this  plan we will see her back in this clinic in 1 year or earlier if there is a problem.   Camellia Denis, MD Allergy / Immunology [facility]

## 2023-09-22 ENCOUNTER — Encounter: Payer: Self-pay | Admitting: Allergy and Immunology

## 2023-12-30 ENCOUNTER — Other Ambulatory Visit (HOSPITAL_BASED_OUTPATIENT_CLINIC_OR_DEPARTMENT_OTHER): Payer: Self-pay

## 2024-09-19 ENCOUNTER — Ambulatory Visit: Admitting: Allergy and Immunology
